# Patient Record
Sex: Female | Born: 1990 | Race: White | Hispanic: No | Marital: Married | Smoking: Current every day smoker
Health system: Southern US, Community
[De-identification: ages and names within clinical notes are randomized; demographics above are authoritative.]

## PROBLEM LIST (undated history)

## (undated) DIAGNOSIS — N921 Excessive and frequent menstruation with irregular cycle: Principal | ICD-10-CM

## (undated) DIAGNOSIS — Z3046 Encounter for surveillance of implantable subdermal contraceptive: Principal | ICD-10-CM

## (undated) DIAGNOSIS — N926 Irregular menstruation, unspecified: Principal | ICD-10-CM

## (undated) DIAGNOSIS — F319 Bipolar disorder, unspecified: Secondary | ICD-10-CM

## (undated) DIAGNOSIS — E119 Type 2 diabetes mellitus without complications: Secondary | ICD-10-CM

## (undated) DIAGNOSIS — Z309 Encounter for contraceptive management, unspecified: Secondary | ICD-10-CM

## (undated) DIAGNOSIS — N970 Female infertility associated with anovulation: Secondary | ICD-10-CM

## (undated) DIAGNOSIS — Z8669 Personal history of other diseases of the nervous system and sense organs: Secondary | ICD-10-CM

## (undated) DIAGNOSIS — N979 Female infertility, unspecified: Secondary | ICD-10-CM

## (undated) DIAGNOSIS — E669 Obesity, unspecified: Secondary | ICD-10-CM

## (undated) DIAGNOSIS — E282 Polycystic ovarian syndrome: Secondary | ICD-10-CM

## (undated) HISTORY — DX: Encounter for contraceptive management, unspecified: Z30.9

## (undated) HISTORY — DX: Female infertility, unspecified: N97.9

## (undated) HISTORY — DX: Excessive and frequent menstruation with irregular cycle: N92.1

## (undated) HISTORY — DX: Bipolar disorder, unspecified: F31.9

## (undated) HISTORY — PX: WISDOM TOOTH EXTRACTION: SHX21

## (undated) HISTORY — DX: Irregular menstruation, unspecified: N92.6

## (undated) HISTORY — DX: Female infertility associated with anovulation: N97.0

## (undated) HISTORY — DX: Encounter for surveillance of implantable subdermal contraceptive: Z30.46

## (undated) HISTORY — DX: Polycystic ovarian syndrome: E28.2

## (undated) HISTORY — DX: Personal history of other diseases of the nervous system and sense organs: Z86.69

## (undated) HISTORY — DX: Obesity, unspecified: E66.9

---
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
## 2010-02-25 ENCOUNTER — Emergency Department (HOSPITAL_COMMUNITY): Admission: EM | Admit: 2010-02-25 | Discharge: 2010-02-25 | Payer: Self-pay | Admitting: Emergency Medicine

## 2010-07-02 ENCOUNTER — Emergency Department (HOSPITAL_COMMUNITY): Admission: EM | Admit: 2010-07-02 | Discharge: 2010-07-02 | Payer: Self-pay | Admitting: Emergency Medicine

## 2010-07-17 ENCOUNTER — Emergency Department (HOSPITAL_COMMUNITY): Admission: EM | Admit: 2010-07-17 | Discharge: 2010-07-17 | Payer: Self-pay | Admitting: Emergency Medicine

## 2010-09-24 ENCOUNTER — Emergency Department (HOSPITAL_COMMUNITY)
Admission: EM | Admit: 2010-09-24 | Discharge: 2010-09-24 | Payer: Self-pay | Source: Home / Self Care | Admitting: Emergency Medicine

## 2010-09-30 LAB — COMPREHENSIVE METABOLIC PANEL WITH GFR
ALT: 14 U/L (ref 0–35)
AST: 17 U/L (ref 0–37)
Albumin: 3.7 g/dL (ref 3.5–5.2)
Alkaline Phosphatase: 82 U/L (ref 39–117)
BUN: 6 mg/dL (ref 6–23)
CO2: 27 meq/L (ref 19–32)
Calcium: 9.2 mg/dL (ref 8.4–10.5)
Chloride: 108 meq/L (ref 96–112)
Creatinine, Ser: 0.63 mg/dL (ref 0.4–1.2)
GFR calc Af Amer: >60 mL/min (ref >60)
GFR calc non Af Amer: >60 mL/min (ref >60)
Glucose, Bld: 85 mg/dL (ref 70–99)
Potassium: 3.9 meq/L (ref 3.5–5.1)
Sodium: 141 meq/L (ref 135–145)
Total Bilirubin: 0.4 mg/dL (ref 0.3–1.2)
Total Protein: 7 g/dL (ref 6.0–8.3)

## 2010-09-30 LAB — URINALYSIS, ROUTINE W REFLEX MICROSCOPIC
Bilirubin Urine: NEGATIVE
Hgb urine dipstick: NEGATIVE
Ketones, ur: NEGATIVE mg/dL
Nitrite: NEGATIVE
Protein, ur: NEGATIVE mg/dL
Specific Gravity, Urine: 1.015 (ref 1.005–1.030)
Urine Glucose, Fasting: NEGATIVE mg/dL
Urobilinogen, UA: 0.2 mg/dL (ref 0.0–1.0)
pH: 6.5 (ref 5.0–8.0)

## 2010-09-30 LAB — POCT PREGNANCY, URINE: Preg Test, Ur: NEGATIVE

## 2010-09-30 LAB — PREGNANCY, URINE: Preg Test, Ur: NEGATIVE

## 2010-09-30 LAB — LIPASE, BLOOD: Lipase: 20 U/L (ref 11–59)

## 2010-10-15 ENCOUNTER — Ambulatory Visit
Admission: RE | Admit: 2010-10-15 | Discharge: 2010-10-15 | Payer: Self-pay | Source: Home / Self Care | Attending: Gastroenterology | Admitting: Gastroenterology

## 2010-10-15 ENCOUNTER — Encounter: Payer: Self-pay | Admitting: Internal Medicine

## 2010-10-15 DIAGNOSIS — R110 Nausea: Secondary | ICD-10-CM | POA: Insufficient documentation

## 2010-10-15 DIAGNOSIS — R109 Unspecified abdominal pain: Secondary | ICD-10-CM | POA: Insufficient documentation

## 2010-10-16 ENCOUNTER — Encounter: Payer: Self-pay | Admitting: Gastroenterology

## 2010-10-21 ENCOUNTER — Encounter: Payer: Self-pay | Admitting: Internal Medicine

## 2010-10-23 NOTE — Assessment & Plan Note (Signed)
Summary: EPIGASTRIC PAIN,NAUSEA/SS   Visit Type:  Initial Consult Referring Provider:  Dr. Loney Hering Primary Care Provider:  Loney Hering  CC:  epigastric pain and nausea.  History of Present Illness: Ms. Cassandra [name] is a pleasant Caucasian 20 year old female who presents at the request of Dr. Loney Hering secondary to epigastric pain and nausea. Reports epigastric/LUQ pain X 1 mos, "sharp", constant 5/6 but worsens intermittently. Not aggravated by eating/drinking. Associated with nausea. Nothing relieves pain. No vomiting. Short use of ibuprofen in past three days, but no hx of chronic NSAIDs. 1 BM daily, denies melena or hematochezia, no constipation. Was given Prilosec on 1/9. Has taken daily X 2 weeks, did not alleviate symptoms. Not currently taking a PPI. Went to [facility] ED 09/24/10, LFTs nl, lipase nl. States had [place] of abdomen, but there are no reports available and hospital states no record of [place] exists.   Current Medications (verified): 1)  None  Allergies (verified): No Known Drug Allergies  Past History:  Past Medical History: Bipolar (not currently on medication)  Past Surgical History: wisdom teeth  Family History: Mother: living, s/p chole, hysterectomy, hip replacement ?HTN. Father:unknown No FH of Colon Cancer: Family History of Stomach Cancer:grandmother  Social History: Cosmetology school Single, no children Lives with mom and step-dad No smoking Alcohol Use - no Illicit Drug Use - no Drug Use:  no  Review of Systems General:  Denies fever, chills, and anorexia. Eyes:  Denies blurring, irritation, and discharge. ENT:  Denies sore throat, hoarseness, and difficulty swallowing. CV:  Denies chest pains and syncope. Resp:  Denies dyspnea at rest and wheezing. GI:  Complains of nausea and abdominal pain; denies difficulty swallowing, pain on swallowing, constipation, change in bowel habits, bloody BM's, and black BMs. GU:  Denies urinary burning and urinary frequency. MS:  Denies  joint pain / LOM, joint swelling, and joint stiffness. Derm:  Denies rash, itching, and dry skin. Neuro:  Denies weakness and syncope. Psych:  Denies depression and anxiety. Endo:  Denies cold intolerance and heat intolerance.  Vital Signs:  Patient profile:   20 year old female Height:      61 inches Weight:      199 pounds BMI:     37.74 Temp:     98.1 degrees F oral Pulse rate:   64 / minute BP sitting:   118 / 88  (left arm) Cuff size:   regular  Vitals Entered By: Hendricks Limes LPN (October 15, 2010 10:45 AM)  Physical Exam  General:  Well developed, well nourished, no acute distress. Head:  Normocephalic and atraumatic. Eyes:  PERRLA, no icterus. Mouth:  No deformity or lesions, dentition normal. Lungs:  Clear throughout to auscultation. Heart:  Regular rate and rhythm; no murmurs, rubs,  or bruits. Abdomen:  obese, +BS, soft, mildly tender to palpation epigastric/LUQ. no HSM, no rebound or guarding.  Msk:  Symmetrical with no gross deformities. Normal posture. Pulses:  Normal pulses noted. Extremities:  No clubbing, cyanosis, edema or deformities noted. Neurologic:  Alert and  oriented x4;  grossly normal neurologically. Psych:  Alert and cooperative. Normal mood and affect.  Impression & Recommendations:  Problem # 1:  ABDOMINAL PAIN-MULTIPLE SITES (ICD-63.37)  20 year old Caucasian female with epigastric and LUQ pain X 1 mos, constant, achy, not r/t eating/drinking. +nausea. short course of ibuprofen past three days but prior denies any use of NSAIDs, goodys, bc. Non-smoker. Prilosec X 2 weeks, taken daily, yet stopped recently. Diff includes gastritis, PUD. less likely biliary component.  Aciphex samples given to pt EGD with Dr. Jena Gauss in near future: the R/B/A discussed in detail; pt stated understanding and desires to proceed. If EGD nl, may need to proceed with [place] of abdomen  Orders: Consultation Level III ([id number])  Problem # 2:  NAUSEA (ICD-787.02)  See #  1.   Orders: Consultation Level III [phone])

## 2010-10-23 NOTE — Letter (Signed)
Summary: EGD ORDER  EGD ORDER   Imported By: Ave Filter 10/15/2010 11:56:55  _____________________________________________________________________  External Attachment:    Type:   Image     Comment:   External Document

## 2010-10-23 NOTE — Letter (Signed)
Summary: REF BY DR BLUTH  REF BY DR BLUTH   Imported By: Rexene Alberts 10/16/2010 15:01:00  _____________________________________________________________________  External Attachment:    Type:   Image     Comment:   External Document

## 2010-10-28 ENCOUNTER — Ambulatory Visit (HOSPITAL_COMMUNITY)
Admission: RE | Admit: 2010-10-28 | Discharge: 2010-10-28 | Disposition: A | Discharge status: Home / Self Care | Payer: Medicaid Other | Source: Ambulatory Visit | Attending: Internal Medicine | Admitting: Internal Medicine

## 2010-10-28 ENCOUNTER — Encounter: Payer: Medicaid Other | Admitting: Internal Medicine

## 2010-10-28 DIAGNOSIS — R1013 Epigastric pain: Secondary | ICD-10-CM

## 2010-10-28 DIAGNOSIS — K449 Diaphragmatic hernia without obstruction or gangrene: Secondary | ICD-10-CM

## 2010-10-28 DIAGNOSIS — R1012 Left upper quadrant pain: Secondary | ICD-10-CM | POA: Insufficient documentation

## 2010-10-28 DIAGNOSIS — R1011 Right upper quadrant pain: Secondary | ICD-10-CM

## 2010-10-30 ENCOUNTER — Ambulatory Visit (HOSPITAL_COMMUNITY)
Admit: 2010-10-30 | Discharge: 2010-10-30 | Disposition: A | Discharge status: Home / Self Care | Payer: Medicaid Other | Source: Ambulatory Visit | Attending: Internal Medicine | Admitting: Internal Medicine

## 2010-10-30 DIAGNOSIS — R1011 Right upper quadrant pain: Secondary | ICD-10-CM | POA: Insufficient documentation

## 2010-10-30 DIAGNOSIS — R1013 Epigastric pain: Secondary | ICD-10-CM | POA: Insufficient documentation

## 2010-10-31 NOTE — Letter (Signed)
Summary: [facility]CS ORDER  TCS ORDER   Imported By: Rexene Alberts 10/21/2010 11:50:51  _____________________________________________________________________  External Attachment:    Type:   Image     Comment:   External Document

## 2010-11-05 NOTE — Op Note (Signed)
  NAME:  [name], Cassandra               ACCOUNT NO.:  [id number]  MEDICAL RECORD NO.:  [id number]           PATIENT TYPE:  O  LOCATION:  [facility]                          FACILITY:  [facility]  PHYSICIAN:  R. Roetta Sessions, M.D. DATE OF BIRTH:  October 16, 1990  DATE OF PROCEDURE:  10/28/2010 DATE OF DISCHARGE:                              OPERATIVE REPORT   PROCEDURE:  EGD diagnostic.  INDICATIONS FOR PROCEDURE:  A 20 year old lady with epigastric some left upper quadrant abdominal pain, some relief with eating.  No dysphagia not much the way of any reflux symptoms, well improvement with acid suppression therapy recently.  EGD is now being done to further evaluate her symptoms.  Risks, benefits, alternatives, limitations, imponderables have been discussed, questions answered.  Please see the documentation in the medical record.  PROCEDURE NOTE:  O2 saturation, blood pressure, pulse, respirations were monitored throughout the entire procedure.  CONSCIOUS SEDATION:  Versed 5 mg IV, Demerol 100 mg IV in divided doses.  INSTRUMENT:  Pentax video chip system.  Cetacaine spray for topical pharyngeal anesthesia.  FINDINGS:  Examination of tubular esophagus revealed no mucosal abnormalities.  EG junction easily traversed.  Stomach:  Gastric cavity was emptied and insufflated well with air. Thorough examination of gastric mucosa including retroflexion of proximal stomach, esophagogastric junction demonstrated only a small hiatal hernia.  Pylorus was patent, easily traversed.  Examination of bulb and second portion revealed no abnormalities.  THERAPEUTIC/DIAGNOSTIC MANEUVERS PERFORMED:  None.  The patient tolerated the procedure well.  IMPRESSION: 1. Normal esophagus, small hiatal hernia, otherwise normal stomach, D1     and D2. 2. No endoscopic explanation for patient's symptoms based on today's     exam.  RECOMMENDATIONS: 1. We will proceed with abdominal/right upper quadrant ultrasound in   the near future. 2. Further recommendations to follow.     Jonathon Bellows, M.D.     RMR/MEDQ  D:  10/28/2010  T:  10/28/2010  Job:  [id number]  cc:   Ernestine Conrad, MD Jonita [place], [place]  Electronically Signed by Lorrin Goodell M.D. on 11/05/2010 02:34:15 PM

## 2012-04-03 IMAGING — CR DG FOOT COMPLETE 3+V*L*
3 series · 3 of 3 positions shown · non-contrast
Comparison: None.

CLINICAL DATA: Fall with pain and swelling.

LEFT FOOT - COMPLETE 3+ VIEW

[view not recorded (1 of 3)]
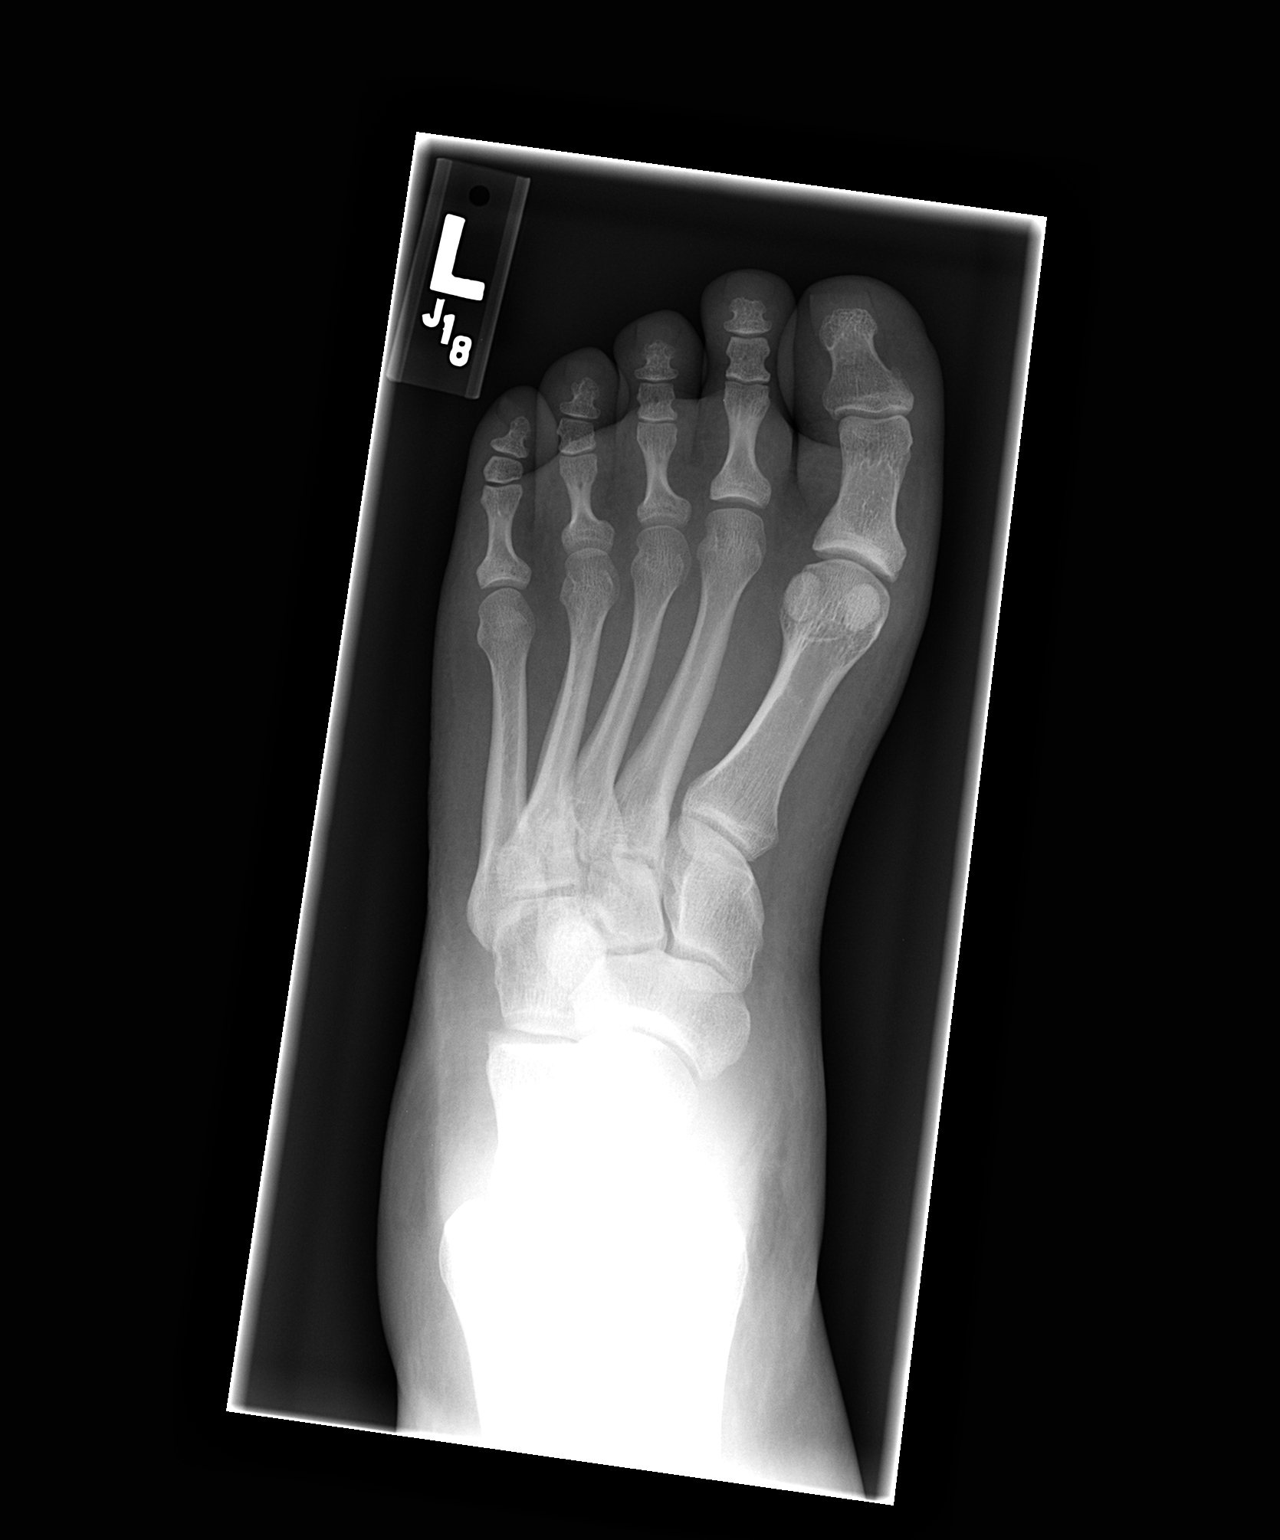

[view not recorded (2 of 3)]
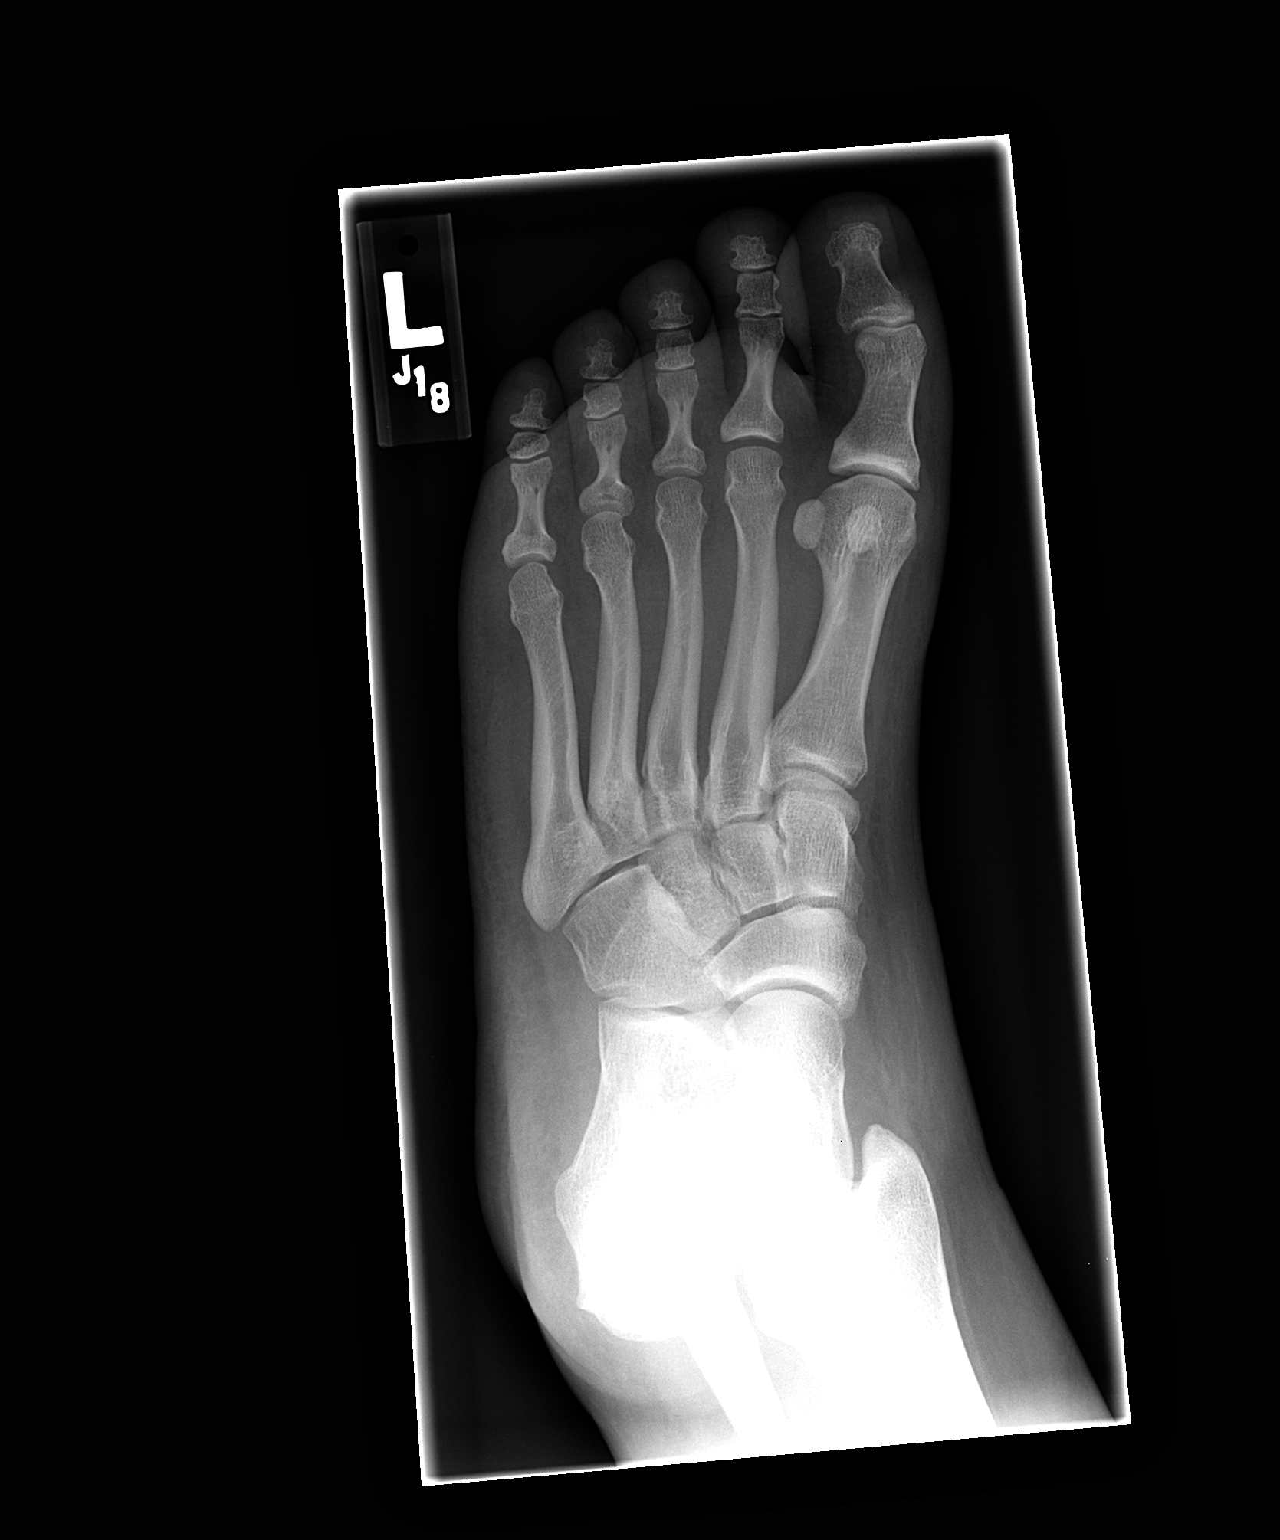

[view not recorded (3 of 3)]
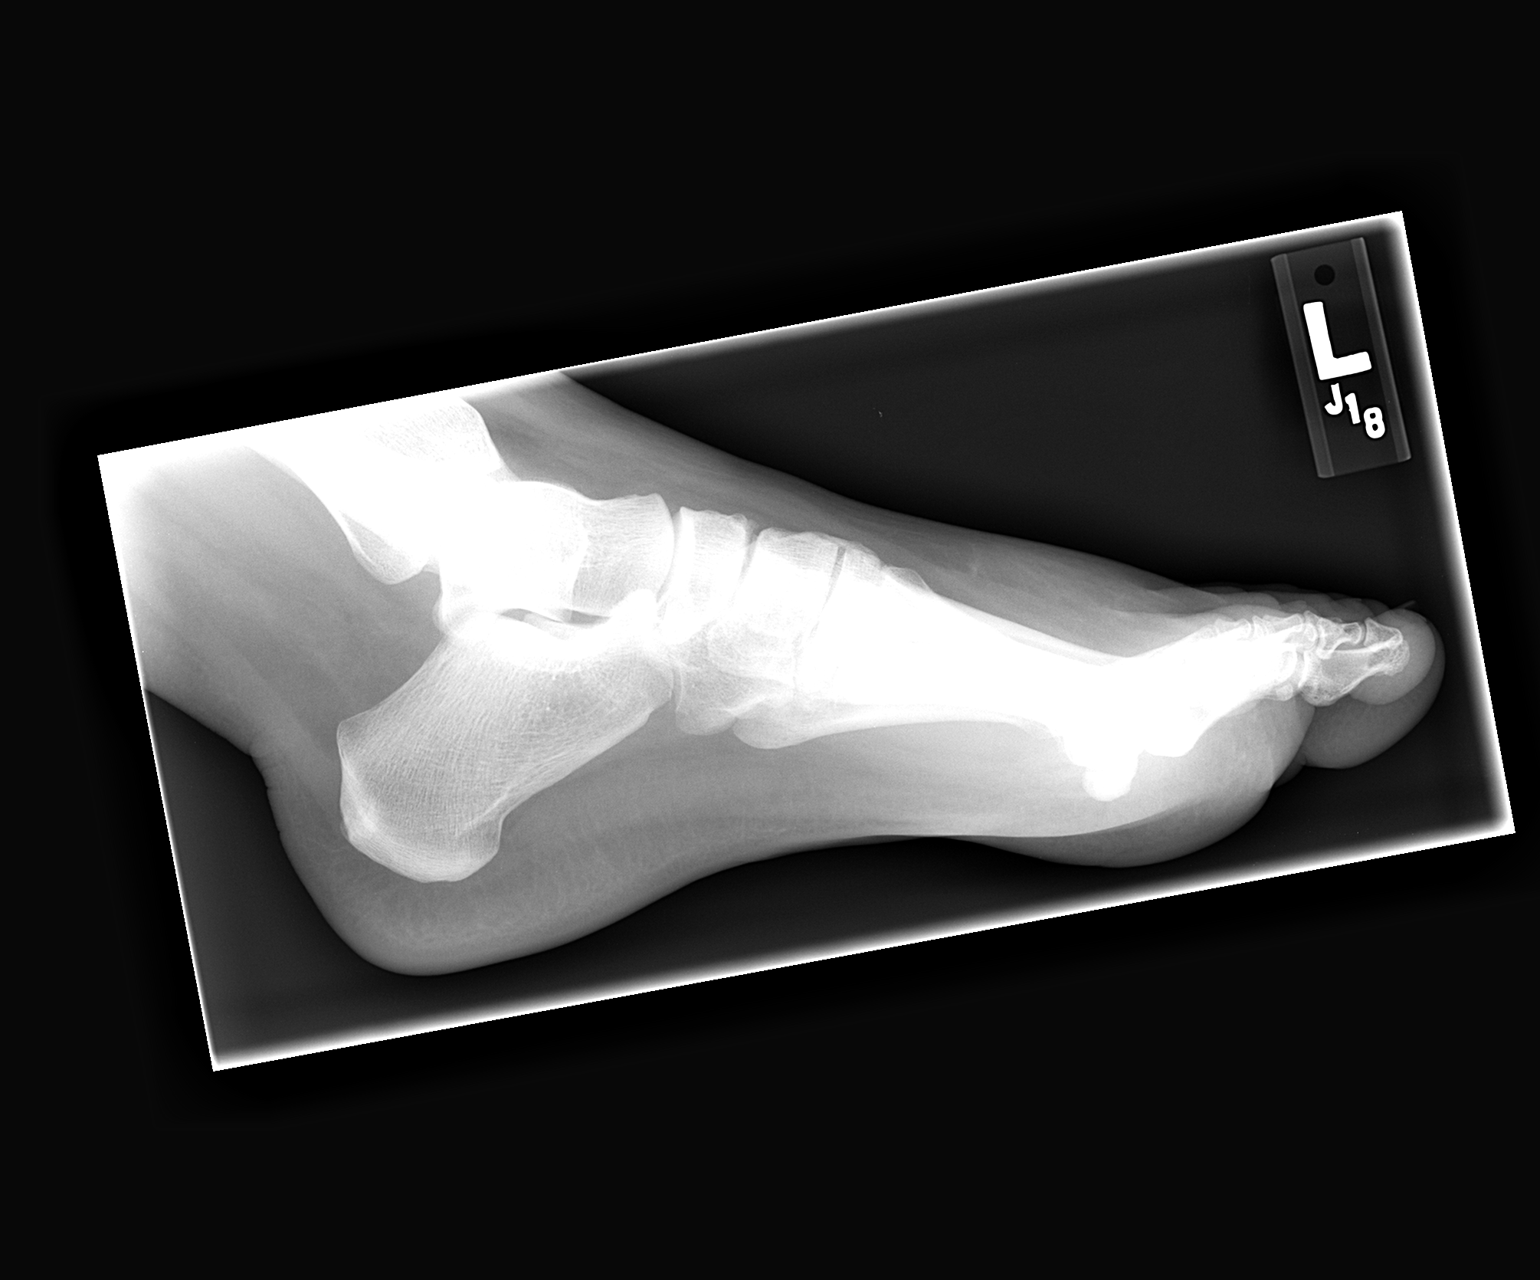

[3 of 3 positions shown; findings below may reference images not displayed]

FINDINGS: There is soft tissue swelling without acute fracture or
joint abnormality.
IMPRESSION: Soft tissue swelling without acute fracture or joint abnormality.

## 2012-07-17 IMAGING — US US ABDOMEN COMPLETE
1 series · 13 of 25 positions shown · non-contrast
Comparison: None

CLINICAL DATA: Right upper quadrant abdominal epigastric pain.

ABDOMINAL ULTRASOUND COMPLETE

[Series 1: us abdomen complete · 0.25mm/px · 13 of 83 slices shown]
[im 1/83]
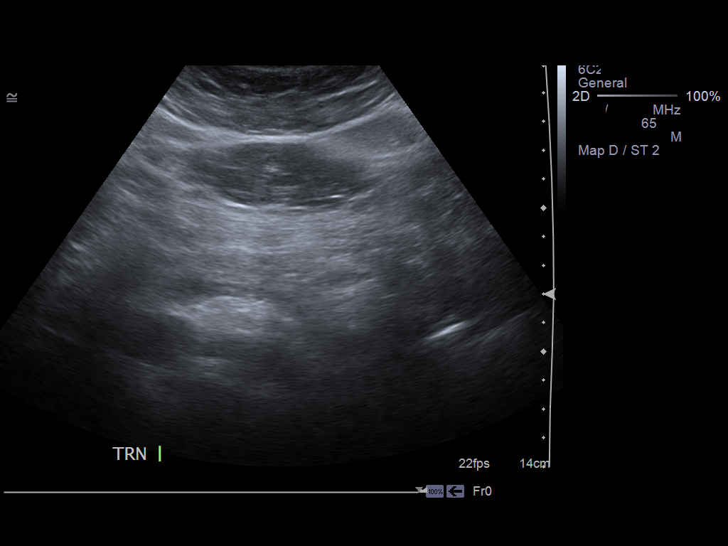
[im 7/83]
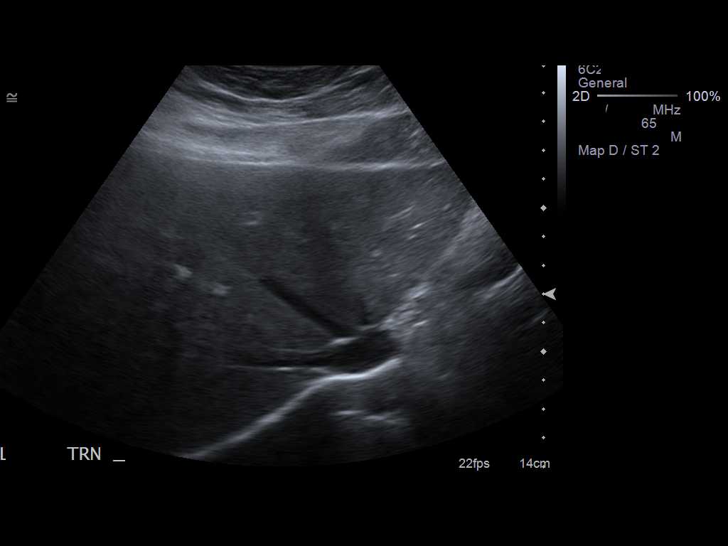
[im 14/83]
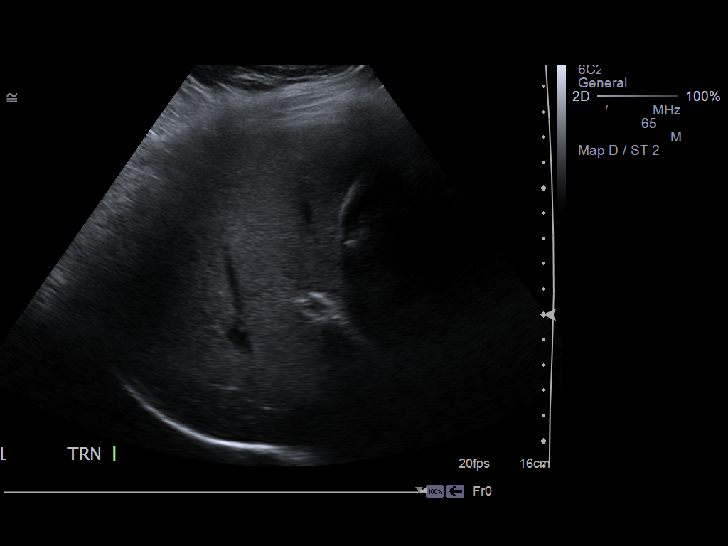
[im 21/83]
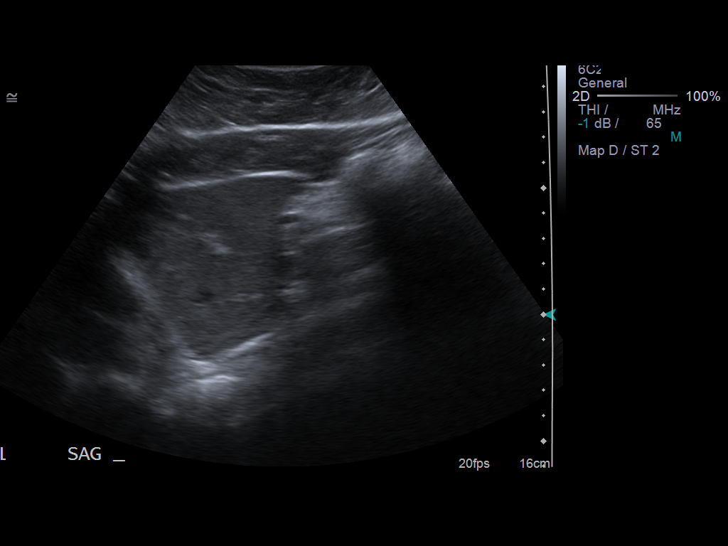
[im 28/83]
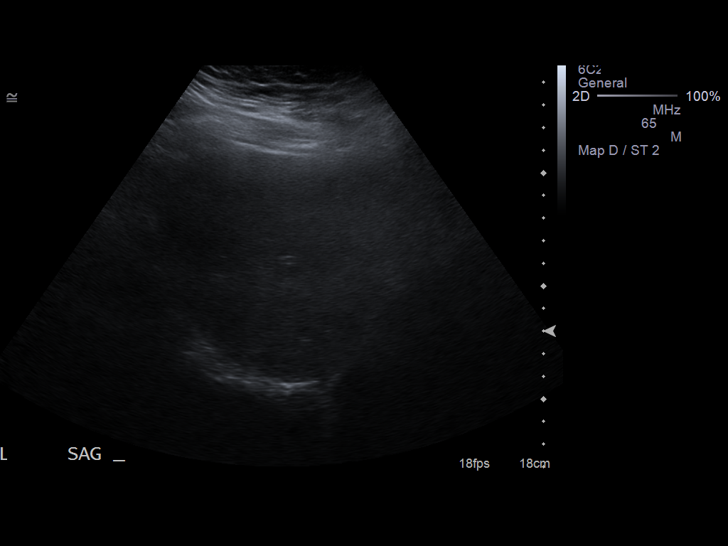
[im 35/83]
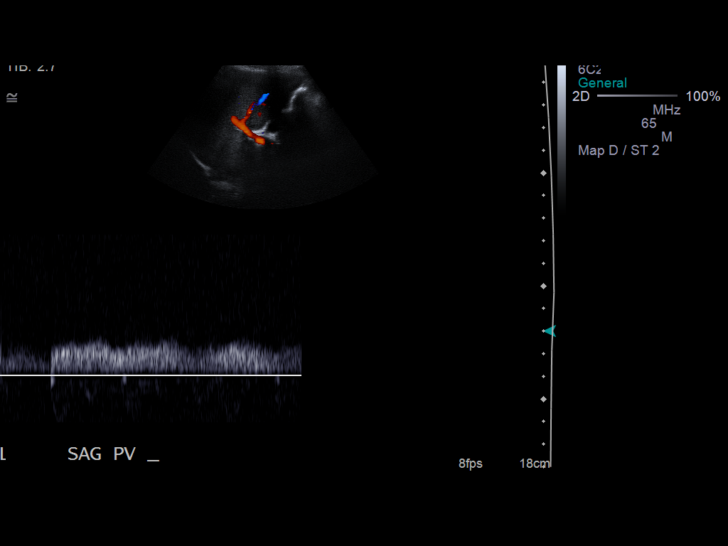
[im 42/83]
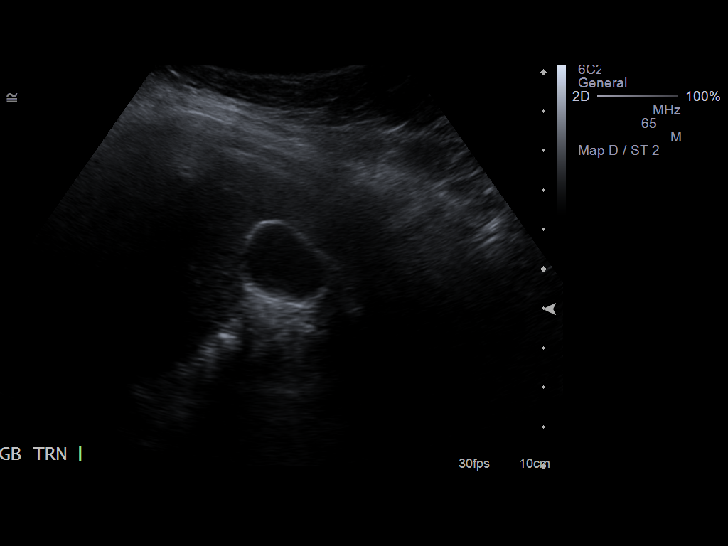
[im 48/83]
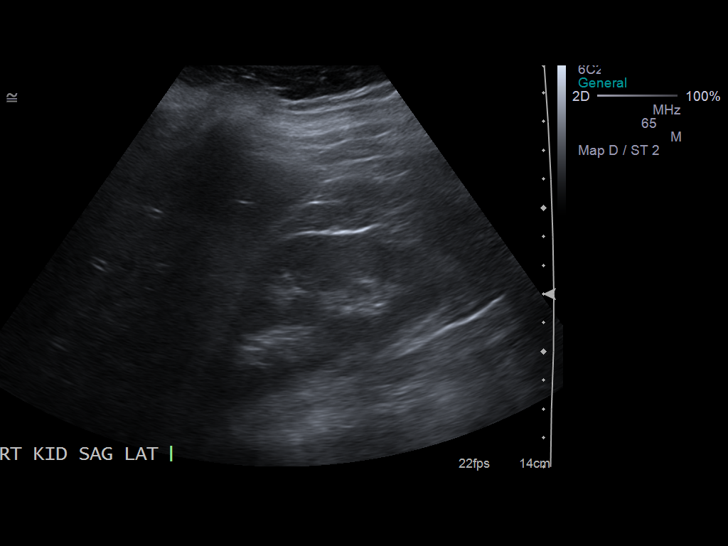
[im 55/83]
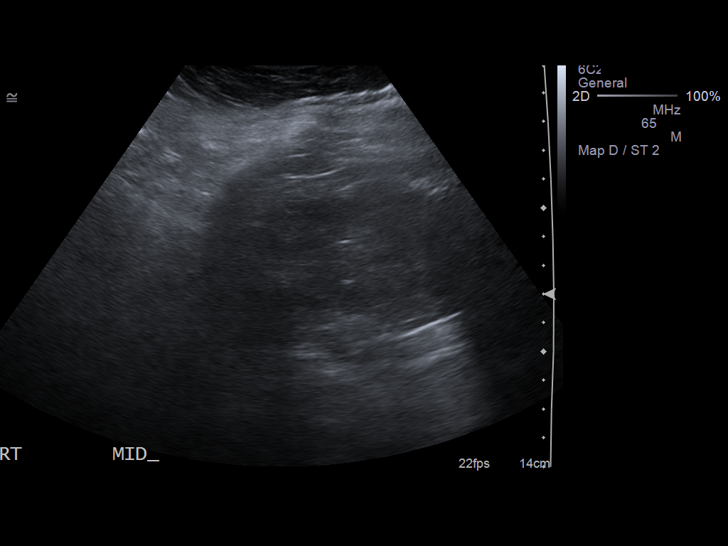
[im 62/83]
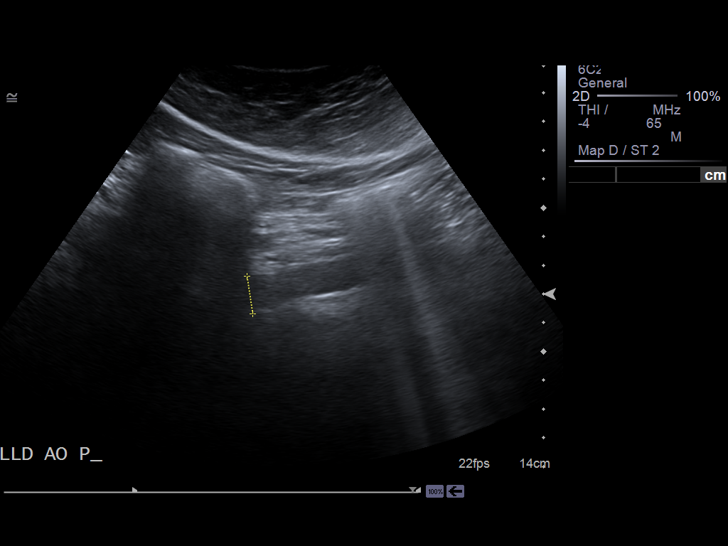
[im 69/83]
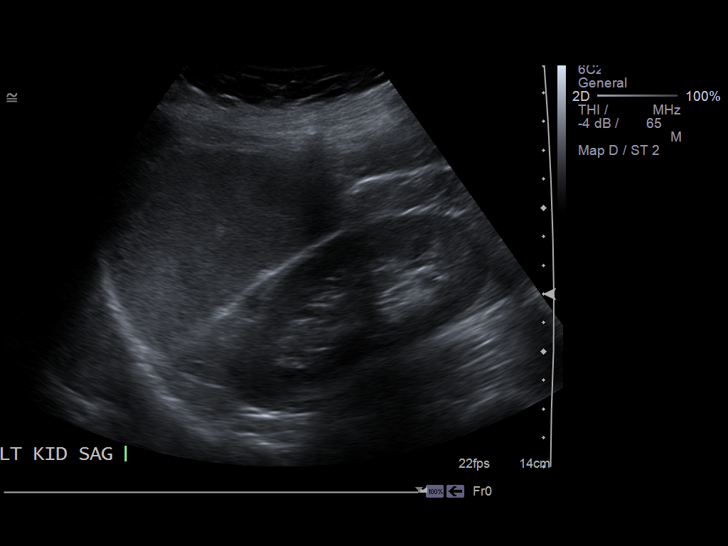
[im 76/83]
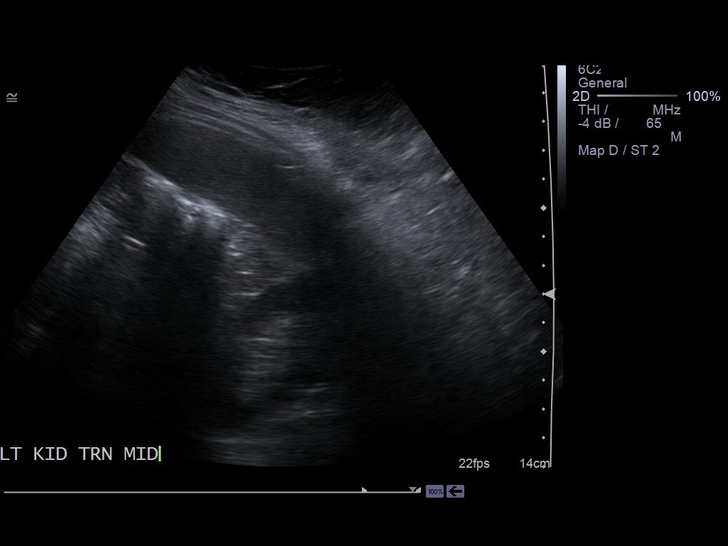
[im 83/83]
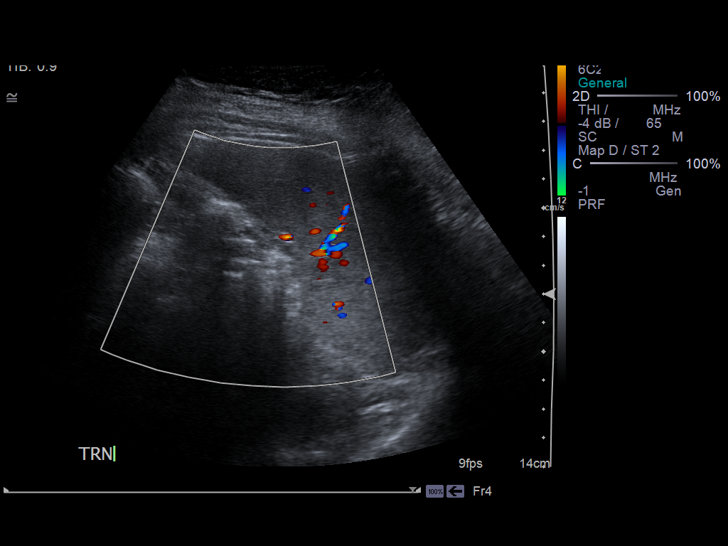

[13 of 25 positions shown; findings below may reference images not displayed]

FINDINGS: Gallbladder: No shadowing gallstones or echogenic sludge. No
gallbladder wall thickening or pericholecystic fluid. The
gallbladder wall thickness measured the upper limits of normal at 3
mm. No sonographic Murphy's sign according to the ultrasound
technologist.

CBD: Normal in caliber measuring 5 mm. No choledocholithiasis is
evident.

Liver:  Normal size and echotexture without focal parenchymal
abnormality.

IVC:  Patent throughout its visualized course in the abdomen.

Pancreas:  Although the pancreas is difficult to visualize in its
entirety, no focal pancreatic abnormality is identified.  Bowel gas
obscures portions of the pancreas.

Spleen:  Normal size and echotexture without focal abnormality.
Length is 7.5 cm.

Right kidney:  No hydronephrosis.  Well-preserved cortex.  Normal
parenchymal echotexture without focal abnormalities.  Right renal
length is 11.1 cm.

Left kidney:  No hydronephrosis.  Well-preserved cortex.  Normal
parenchymal echotexture without focal abnormalities.  Left renal
length is 10.6 cm.

Aorta:  Maximum diameter is 1.3 cm.  No aneurysm is evident.

Ascites:  None.
IMPRESSION: No abdominal pathology was demonstrated.

## 2013-11-22 ENCOUNTER — Ambulatory Visit (INDEPENDENT_AMBULATORY_CARE_PROVIDER_SITE_OTHER): Payer: BC Managed Care – PPO | Admitting: Adult Health

## 2013-11-22 ENCOUNTER — Encounter (INDEPENDENT_AMBULATORY_CARE_PROVIDER_SITE_OTHER): Payer: Self-pay

## 2013-11-22 ENCOUNTER — Encounter: Payer: Self-pay | Admitting: Women's Health

## 2013-11-22 VITALS — BP 130/78 | Ht 62.0 in | Wt 264.0 lb

## 2013-11-22 DIAGNOSIS — Z3049 Encounter for surveillance of other contraceptives: Secondary | ICD-10-CM

## 2013-11-22 DIAGNOSIS — Z309 Encounter for contraceptive management, unspecified: Secondary | ICD-10-CM | POA: Insufficient documentation

## 2013-11-22 DIAGNOSIS — Z3046 Encounter for surveillance of implantable subdermal contraceptive: Secondary | ICD-10-CM | POA: Insufficient documentation

## 2013-11-22 DIAGNOSIS — Z3202 Encounter for pregnancy test, result negative: Secondary | ICD-10-CM

## 2013-11-22 HISTORY — DX: Encounter for contraceptive management, unspecified: Z30.9

## 2013-11-22 HISTORY — DX: Encounter for surveillance of implantable subdermal contraceptive: Z30.46

## 2013-11-22 LAB — POCT URINE PREGNANCY: Preg Test, Ur: NEGATIVE

## 2013-11-22 MED ORDER — NORETHINDRONE 0.35 MG PO TABS: 1.0000 | ORAL_TABLET | Freq: Every day | ORAL | Status: DC

## 2013-11-22 NOTE — Progress Notes (Signed)
Subjective:     Patient ID: Cassandra SchimkeJessica A [name], female   DOB: 04/22/1991, 23 y.o.   MRN: [id number]  HPI Cassandra BumpsJessica is a 23 year old white female in for implanon removal, she got in 2010 and has had only 1 period since then and has gained about 100 lbs.She went to [facility] and they could not find the rod.Has ? History of migraines with aura.  Review of Systems See HPI Reviewed past medical,surgical, social and family history. Reviewed medications and allergies.     Objective:   Physical Exam BP 130/78  Ht 5\' 2"  (1.575 m)  Wt 264 lb (119.75 kg)  BMI 48.27 kg/m2   UPT negative, verbal consent obtained, found scar for insertion and ?rod, Dr Despina HiddenEure came in and left arm cleansed with betadine, and injected with 1.5 cc 2% lidocaine and waited til numb, a #11 blade was used to make small  incision, and forceps was used to try to find rod, but was deep and Dr Despina HiddenEure could not grasp, inserted with 1.5 cc 2% lidocaine again and still could not remove, did remove small amount of sub Q fat.Arm cleaned up, Steri strips applied. Pressure dressing applied. Discussed options of another nexplanon, IUD,DEPO and POP, will try POP. Pt aware will have bruising and be sore tomorrow.  Assessment:     Unable to remove implanon Contraceptive management     Plan:       Use condoms x 4 weeks, keep clean and dry x 24 hours, no heavy lifting, keep steri strips on x 72 hours, Keep pressure dressing on x 24 hours. Start micronor today, Rx Micronor disp 1 pack take 1 daily x 1 year Return in 2 weeks to see Dr Despina HiddenEure for removal Can take advil for pain if needed

## 2013-11-22 NOTE — Patient Instructions (Addendum)
Use condoms x 4 weeks, keep clean and dry x 24 hours, no heavy lifting, keep steri strips on x 72 hours, Keep pressure dressing on x 24 hours.  Start pills today  Return in 2 weeks to see Dr Despina HiddenEure for removal Can take advil for pain if needed

## 2013-12-06 ENCOUNTER — Encounter: Payer: Self-pay | Admitting: Obstetrics & Gynecology

## 2013-12-06 ENCOUNTER — Ambulatory Visit (INDEPENDENT_AMBULATORY_CARE_PROVIDER_SITE_OTHER): Payer: BC Managed Care – PPO | Admitting: Obstetrics & Gynecology

## 2013-12-06 ENCOUNTER — Encounter: Payer: BC Managed Care – PPO | Admitting: Obstetrics & Gynecology

## 2013-12-06 VITALS — BP 110/80 | Ht 62.0 in | Wt 263.0 lb

## 2013-12-06 DIAGNOSIS — Z309 Encounter for contraceptive management, unspecified: Secondary | ICD-10-CM

## 2013-12-06 DIAGNOSIS — Z3046 Encounter for surveillance of implantable subdermal contraceptive: Secondary | ICD-10-CM

## 2014-01-22 NOTE — Progress Notes (Signed)
Patient ID: Evelene CroonJessica Capps, female   DOB: 09/23/1990, 23 y.o.   MRN: [id number] Pt returned for repeat attempt at nexplanon removal I was able to retrieve it this time after prep, drape and incision of skin Removed intact Bandage placed  Follow up prn On ocp  Past Medical History  Diagnosis Date  . Bipolar 1 disorder     no medication  . Encounter for Implanon removal 11/22/2013    Could not remove today will try again in 2 weeks, has ben in since 2010 and pt has gained weight  . Contraceptive management 11/22/2013    Past Surgical History  Procedure Laterality Date  . Wisdom tooth extraction      OB History   Grav Para Term Preterm Abortions TAB SAB Ect Mult Living                  No Known Allergies  History   Social History  . Marital Status: Single    Spouse Name: N/A    Number of Children: N/A  . Years of Education: N/A   Social History Main Topics  . Smoking status: Former Smoker    Types: Cigarettes  . Smokeless tobacco: Never Used  . Alcohol Use: Yes     Comment: socially  . Drug Use: No  . Sexual Activity: Yes    Birth Control/ Protection: Implant, Pill, Condom   Other Topics Concern  . None   Social History Narrative  . None    Family History  Problem Relation Age of Onset  . Cancer Maternal Grandmother     stomach  . Cancer Maternal Grandfather     lung  . COPD Mother

## 2014-01-31 ENCOUNTER — Encounter: Payer: Self-pay | Admitting: Adult Health

## 2014-01-31 ENCOUNTER — Other Ambulatory Visit (HOSPITAL_COMMUNITY)
Admission: RE | Admit: 2014-01-31 | Discharge: 2014-01-31 | Disposition: A | Discharge status: Home / Self Care | Payer: BC Managed Care – PPO | Source: Ambulatory Visit | Attending: Obstetrics and Gynecology | Admitting: Obstetrics and Gynecology

## 2014-01-31 ENCOUNTER — Ambulatory Visit (INDEPENDENT_AMBULATORY_CARE_PROVIDER_SITE_OTHER): Payer: BC Managed Care – PPO | Admitting: Adult Health

## 2014-01-31 VITALS — BP 120/80 | HR 78 | Ht 62.0 in | Wt 264.0 lb

## 2014-01-31 DIAGNOSIS — Z01419 Encounter for gynecological examination (general) (routine) without abnormal findings: Secondary | ICD-10-CM | POA: Insufficient documentation

## 2014-01-31 NOTE — Progress Notes (Signed)
Patient ID: Cassandra CroonJessica [name], female   DOB: 09/21/1990, 23 y.o.   MRN: [id number] History of Present Illness: Cassandra BumpsJessica is a 23 year old white female in for a pap and physical. She says left arm is sore where nexplanon removed.  Current Medications, Allergies, Past Medical History, Past Surgical History, Family History and Social History were reviewed in [facility] electronic medical record.     Review of Systems: Patient denies any headaches, blurred vision, shortness of breath, chest pain, abdominal pain, problems with bowel movements, urination, or intercourse. No joint pain or mood changes at present.She has not had a period on Micronor, did spot once.But she says she may want a baby in about 3 years.    Physical Exam:BP 120/80  Pulse 78  Ht 5\' 2"  (1.575 m)  Wt 264 lb (119.75 kg)  BMI 48.27 kg/m2 General:  Well developed, well nourished, no acute distress Skin:  Warm and dry Neck:  Midline trachea, normal thyroid Lungs; Clear to auscultation bilaterally Breast:  No dominant palpable mass, retraction, or nipple discharge Cardiovascular: Regular rate and rhythm Abdomen:  Soft, non tender, no hepatosplenomegaly,obese Pelvic:  External genitalia is normal in appearance.  The vagina is normal in appearance.The cervix is nulliparous, pap performed.  Uterus is felt to be normal size, shape, and contour.  No  adnexal masses or tenderness noted. Extremities:  No swelling or varicosities noted Psych:  No mood changes, alert and cooperative, seems happy No bruising or swelling or redness of left arm. Discussed that she needed to get healthy before trying to get pregnant, get shots up to date, check labs, diet changes.  Impression: Yearly gyn exam   Plan: Physical in 1 year Return in 1 week for fasting labs, CBC,CMP,TSH and lipids Continue micronor Review handout on preparing for a baby

## 2014-01-31 NOTE — Patient Instructions (Addendum)
Physical in 1 year Return in 1 week for fasting labs Preparing for Pregnancy Preparing for pregnancy (preconceptual care) by getting counseling and information from your caregiver before getting pregnant is a good idea. It will help you and your baby have a better chance to have a healthy, safe pregnancy and delivery of your baby. Make an appointment with your caregiver to talk about your health, medical, and family history and how to prepare yourself before getting pregnant. Your caregiver will do a complete physical exam and a Pap test. They will want to know:  About you, your spouse or partner, and your family's medical and genetic history.  If you are eating a balanced diet and drinking enough fluids.  What vitamins and mineral supplements you are taking. This includes taking folic acid before getting pregnant to help prevent birth defects.  What medications you are taking including prescription, over-the-counter and herbal medications.  If there is any substance abuse like alcohol, smoking, and illegal drugs.  If there is any mental or physical domestic violence.  If there is any risk of sexually transmitted disease between you and your partner.  What immunizations and vaccinations you have had and what you may need before getting pregnant.  If you should get tested for HIV infection.  If there is any exposure to chemical or toxic substances at home or work.  If there are medical problems you have that need to be treated and kept under control before getting pregnant such as diabetes, high blood pressure or others.  If there were any past surgeries, pregnancies and problems with them.  What your current weight is and to set a goal as to how much weight you should gain while pregnant. Also, they will check if you should lose or gain weight before getting pregnant.  What is your exercise routine and what it is safe when you are pregnant.  If there are any physical disabilities  that need to be addressed.  About spacing your pregnancies when there are other children.  If there is a financial problem that may affect you having a child. After talking about the above points with your caregiver, your caregiver will give you advice on how to help treat and work with you on solving any issues, if necessary, before getting pregnant. The goal is to have a healthy and safe pregnancy for you and your baby. You should keep an accurate record of your menstrual periods because it will help in determining your due date. Immunizations that you should have before getting pregnant:   Regular measles, [place] measles (rubella) and mumps.  Tetanus and diphtheria.  Chickenpox, if not immune.  Herpes zoster (Varicella) if not immune.  Human papilloma virus vaccine (HPV) between the age of 74 and 14 years old.  Hepatitis A vaccine.  Hepatitis B vaccine.  Influenza vaccine.  Pneumococcal vaccine (pneumonia). You should avoid getting pregnant for one month after getting vaccinated with a live virus vaccine such as [place] measles (rubella) which is in the MMR (Measles, Mumps and Rubella) vaccine. Other immunizations may be necessary depending on where you live, such as malaria. Ask your caregiver if any other immunizations are needed for you. HOME CARE INSTRUCTIONS   Follow the advice of your caregiver.  Before getting pregnant:  Begin taking vitamins, supplements, and 0.4 milligrams folic acid daily.  Get your immunizations up-to-date.  Get help from a nutrition counselor if you do not understand what a balanced diet is, need help with a special medical  diet or if you need help to lose or gain weight.  Begin exercising.  Stop smoking, taking illegal drugs, and drinking alcoholic beverages.  Get counseling if there is and type of domestic violence.  Get checked for sexually transmitted diseases including HIV.  Get any medical problems under control (diabetes, high blood  pressure, convulsions, asthma or others).  Resolve any financial concerns or create a plan to do so.  Be sure you and your spouse or partner are ready to have a baby.  Keep an accurate record of your menstrual periods. Document Released: 08/14/2008 Document Revised: 06/22/2013 Document Reviewed: 08/14/2008 [facility] Patient Information 2014 [place].

## 2014-02-13 ENCOUNTER — Other Ambulatory Visit: Payer: BC Managed Care – PPO

## 2014-02-13 DIAGNOSIS — Z13220 Encounter for screening for lipoid disorders: Secondary | ICD-10-CM

## 2014-02-13 DIAGNOSIS — Z0000 Encounter for general adult medical examination without abnormal findings: Secondary | ICD-10-CM

## 2014-02-13 DIAGNOSIS — Z1329 Encounter for screening for other suspected endocrine disorder: Secondary | ICD-10-CM

## 2014-02-14 LAB — LIPID PANEL
Cholesterol: 164 mg/dL (ref 0–200)
HDL: 30 mg/dL — ABNORMAL LOW (ref >39)
LDL Cholesterol: 107 mg/dL — ABNORMAL HIGH (ref 0–99)
Total CHOL/HDL Ratio: 5.5 Ratio
Triglycerides: 136 mg/dL (ref <150)
VLDL: 27 mg/dL (ref 0–40)

## 2014-02-14 LAB — COMPREHENSIVE METABOLIC PANEL
ALT: 18 U/L (ref 0–35)
AST: 15 U/L (ref 0–37)
Albumin: 3.7 g/dL (ref 3.5–5.2)
Alkaline Phosphatase: 76 U/L (ref 39–117)
BUN: 7 mg/dL (ref 6–23)
CO2: 26 mEq/L (ref 19–32)
Calcium: 9.1 mg/dL (ref 8.4–10.5)
Chloride: 104 mEq/L (ref 96–112)
Creat: 0.54 mg/dL (ref 0.50–1.10)
Glucose, Bld: 96 mg/dL (ref 70–99)
Potassium: 4.7 mEq/L (ref 3.5–5.3)
Sodium: 139 mEq/L (ref 135–145)
Total Bilirubin: 0.4 mg/dL (ref 0.2–1.2)
Total Protein: 6.3 g/dL (ref 6.0–8.3)

## 2014-02-14 LAB — CBC
HCT: 42 % (ref 36.0–46.0)
Hemoglobin: 13.9 g/dL (ref 12.0–15.0)
MCH: 26.8 pg (ref 26.0–34.0)
MCHC: 33.1 g/dL (ref 30.0–36.0)
MCV: 81.1 fL (ref 78.0–100.0)
Platelets: 303 10*3/uL (ref 150–400)
RBC: 5.18 MIL/uL — ABNORMAL HIGH (ref 3.87–5.11)
RDW: 16 % — ABNORMAL HIGH (ref 11.5–15.5)
WBC: 9.4 10*3/uL (ref 4.0–10.5)

## 2014-02-14 LAB — TSH: TSH: 2.579 u[IU]/mL (ref 0.350–4.500)

## 2014-02-15 ENCOUNTER — Telehealth: Payer: Self-pay | Admitting: *Deleted

## 2014-02-15 NOTE — Telephone Encounter (Signed)
Pt stated that she had labs done on Monday and wanted results. Pt aware of all results.

## 2014-09-26 ENCOUNTER — Encounter: Payer: Self-pay | Admitting: Adult Health

## 2014-09-26 ENCOUNTER — Ambulatory Visit (INDEPENDENT_AMBULATORY_CARE_PROVIDER_SITE_OTHER): Payer: BLUE CROSS/BLUE SHIELD | Admitting: Adult Health

## 2014-09-26 VITALS — BP 120/72 | Ht 62.0 in | Wt 261.0 lb

## 2014-09-26 DIAGNOSIS — N926 Irregular menstruation, unspecified: Secondary | ICD-10-CM

## 2014-09-26 HISTORY — DX: Irregular menstruation, unspecified: N92.6

## 2014-09-26 NOTE — Patient Instructions (Signed)
Polycystic Ovarian Syndrome Polycystic ovarian syndrome (PCOS) is a common hormonal disorder among women of reproductive age. Most women with PCOS grow many small cysts on their ovaries. PCOS can cause problems with your periods and make it difficult to get pregnant. It can also cause an increased risk of miscarriage with pregnancy. If left untreated, PCOS can lead to serious health problems, such as diabetes and heart disease. CAUSES The cause of PCOS is not fully understood, but genetics may be a factor. SIGNS AND SYMPTOMS   Infrequent or no menstrual periods.   Inability to get pregnant (infertility) because of not ovulating.   Increased growth of hair on the face, chest, stomach, back, thumbs, thighs, or toes.   Acne, oily skin, or dandruff.   Pelvic pain.   Weight gain or obesity, usually carrying extra weight around the waist.   Type 2 diabetes.   High cholesterol.   High blood pressure.   Female-pattern baldness or thinning hair.   Patches of thickened and dark brown or black skin on the neck, arms, breasts, or thighs.   Tiny excess flaps of skin (skin tags) in the armpits or neck area.   Excessive snoring and having breathing stop at times while asleep (sleep apnea).   Deepening of the voice.   Gestational diabetes when pregnant.  DIAGNOSIS  There is no single test to diagnose PCOS.   Your health care provider will:   Take a medical history.   Perform a pelvic exam.   Have ultrasonography done.   Check your female and female hormone levels.   Measure glucose or sugar levels in the blood.   Do other blood tests.   If you are producing too many female hormones, your health care provider will make sure it is from PCOS. At the physical exam, your health care provider will want to evaluate the areas of increased hair growth. Try to allow natural hair growth for a few days before the visit.   During a pelvic exam, the ovaries may be enlarged  or swollen because of the increased number of small cysts. This can be seen more easily by using vaginal ultrasonography or screening to examine the ovaries and lining of the uterus (endometrium) for cysts. The uterine lining may become thicker if you have not been having a regular period.  TREATMENT  Because there is no cure for PCOS, it needs to be managed to prevent problems. Treatments are based on your symptoms. Treatment is also based on whether you want to have a baby or whether you need contraception.  Treatment may include:   Progesterone hormone to start a menstrual period.   Birth control pills to make you have regular menstrual periods.   Medicines to make you ovulate, if you want to get pregnant.   Medicines to control your insulin.   Medicine to control your blood pressure.   Medicine and diet to control your high cholesterol and triglycerides in your blood.  Medicine to reduce excessive hair growth.  Surgery, making small holes in the ovary, to decrease the amount of female hormone production. This is done through a long, lighted tube (laparoscope) placed into the pelvis through a tiny incision in the lower abdomen.  HOME CARE INSTRUCTIONS  Only take over-the-counter or prescription medicine as directed by your health care provider.  Pay attention to the foods you eat and your activity levels. This can help reduce the effects of PCOS.  Keep your weight under control.  Eat foods that are   low in carbohydrate and high in fiber.  Exercise regularly. SEEK MEDICAL CARE IF:  Your symptoms do not get better with medicine.  You have new symptoms. Document Released: 12/26/2004 Document Revised: 06/22/2013 Document Reviewed: 02/17/2013 [facility] Patient Information 2015 [place]. This information is not intended to replace advice given to you by your health care provider. Make sure you discuss any questions you have with your health care provider. Keep taking  micronor Return in 1 week for US and see me

## 2014-09-26 NOTE — Progress Notes (Signed)
Subjective:     Patient ID: Cassandra [name], female   DOB: 11/05/1990, 24 y.o.   MRN: [id number]  HPI Cassandra [name] is a 24 year old white female in complaining of no "real" period just spots some.She started at a 16 and periods never regular, has been about 6 years since period had flow.She had been on implanon and then micronor.She is getting married next month.And she wants children.  Review of Systems See HPI Reviewed past medical,surgical, social and family history. Reviewed medications and allergies.     Objective:   Physical Exam BP 120/72 mmHg  Ht 5\' 2"  (1.575 m)  Wt 261 lb (118.389 kg)  BMI 47.73 kg/m2   Skin warm and dry. Neck: mid line trachea, normal thyroid. Lungs: clear to ausculation bilaterally. Cardiovascular: regular rate and rhythm.Discussed getting US to assess ovaries, may have PCO.  Assessment:     Irregular periods    Plan:    Review handout on PCO Continue micronor Return in 1 week for US and see me

## 2014-10-05 ENCOUNTER — Ambulatory Visit (INDEPENDENT_AMBULATORY_CARE_PROVIDER_SITE_OTHER): Payer: BLUE CROSS/BLUE SHIELD

## 2014-10-05 ENCOUNTER — Ambulatory Visit (INDEPENDENT_AMBULATORY_CARE_PROVIDER_SITE_OTHER): Payer: BLUE CROSS/BLUE SHIELD | Admitting: Adult Health

## 2014-10-05 ENCOUNTER — Encounter: Payer: Self-pay | Admitting: Adult Health

## 2014-10-05 VITALS — BP 118/80 | Ht 62.0 in | Wt 257.0 lb

## 2014-10-05 DIAGNOSIS — E282 Polycystic ovarian syndrome: Secondary | ICD-10-CM

## 2014-10-05 DIAGNOSIS — N926 Irregular menstruation, unspecified: Secondary | ICD-10-CM

## 2014-10-05 HISTORY — DX: Polycystic ovarian syndrome: E28.2

## 2014-10-05 MED ORDER — NORETHINDRONE 0.35 MG PO TABS: 1.0000 | ORAL_TABLET | Freq: Every day | ORAL | Status: DC

## 2014-10-05 NOTE — Patient Instructions (Signed)
Will talk when labs back Take micronor daily till wants to be pregnant, will start clomid then Try to lose weight

## 2014-10-05 NOTE — Progress Notes (Signed)
Subjective:     Patient ID: Cassandra [name], female   DOB: 04/09/1991, 24 y.o.   MRN: [id number]  HPI Cassandra [name] is a 24 year old white female in for [place] for ?PCO, has irregular periods.  Review of Systems See HPI Reviewed past medical,surgical, social and family history. Reviewed medications and allergies.      Objective:   Physical Exam BP 118/80 mmHg  Ht 5\' 2"  (1.575 m)  Wt 257 lb (116.574 kg)  BMI 46.99 kg/m2   Had US that showed bilateral PCO appearance of ovaries, endometrium 5.0 mm and symmetrical, uterus WNL, pt aware of this, also discussed with Dr Despina HiddenEure.Pt had reviewed PCO handout, no questions.  Assessment:    Irregular periods PCO appearance    Plan:    Try to lose weight Refilled micronor x 1 year Keep taking micronor til ready for pregnancy will give clomid then Check A1c next week, may add metformin

## 2014-10-10 ENCOUNTER — Other Ambulatory Visit: Payer: BLUE CROSS/BLUE SHIELD

## 2014-10-11 ENCOUNTER — Other Ambulatory Visit: Payer: BLUE CROSS/BLUE SHIELD

## 2014-10-11 DIAGNOSIS — Z139 Encounter for screening, unspecified: Secondary | ICD-10-CM

## 2014-10-13 LAB — HEMOGLOBIN A1C
Est. average glucose Bld gHb Est-mCnc: 134 mg/dL
Hgb A1c MFr Bld: 6.3 % — ABNORMAL HIGH (ref 4.8–5.6)

## 2014-10-16 ENCOUNTER — Telehealth: Payer: Self-pay | Admitting: Adult Health

## 2014-10-16 MED ORDER — METFORMIN HCL 500 MG PO TABS: 500.0000 mg | ORAL_TABLET | Freq: Every day | ORAL | Status: DC

## 2014-10-16 NOTE — Telephone Encounter (Signed)
Left message that  A1c 6.3, will rx metformin 500 mg 1 daily and try to lose weight by decreasing carbs and calories, and call me back with any questions

## 2014-10-16 NOTE — Telephone Encounter (Signed)
Pt aware of labs wil recheck A1c in 6 months take metformin and eat 6 x daily with increased protein,decrease carbs

## 2014-12-13 ENCOUNTER — Ambulatory Visit (INDEPENDENT_AMBULATORY_CARE_PROVIDER_SITE_OTHER): Payer: BLUE CROSS/BLUE SHIELD | Admitting: Adult Health

## 2014-12-13 ENCOUNTER — Encounter: Payer: Self-pay | Admitting: Adult Health

## 2014-12-13 VITALS — BP 100/60 | HR 72 | Ht 62.0 in | Wt 253.0 lb

## 2014-12-13 DIAGNOSIS — N921 Excessive and frequent menstruation with irregular cycle: Secondary | ICD-10-CM | POA: Diagnosis not present

## 2014-12-13 HISTORY — DX: Excessive and frequent menstruation with irregular cycle: N92.1

## 2014-12-13 NOTE — Patient Instructions (Addendum)
Keep taking micronor and metformin  Pap in 2017  Follow up as scheduled

## 2014-12-13 NOTE — Progress Notes (Signed)
Subjective:     Patient ID: Cassandra [name], female   DOB: 07/01/1991, 24 y.o.   MRN: [id number]  HPI Cassandra [name] is a 24 year old white female, recently married in complains of irregular bleeding for 2 months, is on micronor and started metformin in January.She had A1c 6.3 and has history of PCO.The period lasts 4-5 days then spots light not even enough to get on pad.No pain or odor.  Review of Systems + irregular bleeding, all other systems negative Reviewed past medical,surgical, social and family history. Reviewed medications and allergies.     Objective:   Physical Exam BP 100/60 mmHg  Pulse 72  Ht 5\' 2"  (1.575 m)  Wt 253 lb (114.76 kg)  BMI 46.26 kg/m2  LMP 12/10/2014 Skin warm and dry.Pelvic: external genitalia is normal in appearance no lesions, vagina: scant discharge without odor,urethra has no lesions or masses noted, cervix:smooth, uterus: normal size, shape and contour, non tender, no masses felt, adnexa: no masses or tenderness noted. Bladder is non tender and no masses felt.   Has lost 4 lbs since visit in January.Reassured that irregular bleeding is not related to infection, continue micronor.   Assessment:    Irregular bleeding(BTB)    Plan:     Continue micronor and metformin  Follow up as scheduled to check A1c   Keep trying for weight loss

## 2015-01-01 ENCOUNTER — Other Ambulatory Visit: Payer: BLUE CROSS/BLUE SHIELD

## 2015-01-01 NOTE — Progress Notes (Signed)
Pt had A1C checked 10/11/14. Pt to come back 01/12/15 to have lab repeated so it will be over 90 days for insurance reasons. Pt voiced understanding and was fine with that. JSY

## 2015-01-12 ENCOUNTER — Other Ambulatory Visit: Payer: BLUE CROSS/BLUE SHIELD

## 2015-01-12 DIAGNOSIS — R7309 Other abnormal glucose: Secondary | ICD-10-CM

## 2015-01-13 LAB — HEMOGLOBIN A1C
Est. average glucose Bld gHb Est-mCnc: 126 mg/dL
Hgb A1c MFr Bld: 6 % — ABNORMAL HIGH (ref 4.8–5.6)

## 2015-01-15 ENCOUNTER — Telehealth: Payer: Self-pay | Admitting: Adult Health

## 2015-01-15 NOTE — Telephone Encounter (Signed)
Left message that A1c 6 which is better was 6.3, 3 months ago.

## 2015-02-13 ENCOUNTER — Telehealth: Payer: Self-pay | Admitting: *Deleted

## 2015-02-13 NOTE — Telephone Encounter (Signed)
Pt is going to stop POP and see what periods do wants to get pregnant, she is aware may need clomid, she is taking prenatal vitamins and is on metformin

## 2015-02-28 ENCOUNTER — Ambulatory Visit (INDEPENDENT_AMBULATORY_CARE_PROVIDER_SITE_OTHER): Payer: BLUE CROSS/BLUE SHIELD | Admitting: Adult Health

## 2015-02-28 ENCOUNTER — Encounter: Payer: Self-pay | Admitting: Adult Health

## 2015-02-28 VITALS — BP 130/84 | HR 76 | Ht 62.0 in | Wt 257.0 lb

## 2015-02-28 DIAGNOSIS — N926 Irregular menstruation, unspecified: Secondary | ICD-10-CM | POA: Diagnosis not present

## 2015-02-28 DIAGNOSIS — Z3202 Encounter for pregnancy test, result negative: Secondary | ICD-10-CM

## 2015-02-28 DIAGNOSIS — Z319 Encounter for procreative management, unspecified: Secondary | ICD-10-CM

## 2015-02-28 DIAGNOSIS — E282 Polycystic ovarian syndrome: Secondary | ICD-10-CM

## 2015-02-28 LAB — POCT URINE PREGNANCY: Preg Test, Ur: NEGATIVE

## 2015-02-28 MED ORDER — CLOMIPHENE CITRATE 50 MG PO TABS: ORAL_TABLET | ORAL | Status: DC

## 2015-02-28 NOTE — Patient Instructions (Signed)
Call with period Take clomid days 3-7 of cycle Check progesterone day 21 fo cycle Have sex every other day, days 7-24, pee before sex and lay there 30 minutes after sex Clomiphene tablets What is this medicine? CLOMIPHENE (KLOE mi feen) is a fertility drug that increases the chance of pregnancy. It helps women ovulate (produce a mature egg) during their cycle. This medicine may be used for other purposes; ask your health care provider or pharmacist if you have questions. COMMON BRAND NAME(S): Clomid, Serophene What should I tell my health care provider before I take this medicine? They need to know if you have any of these conditions: -adrenal gland disease -blood vessel disease or blood clots -cyst on the ovary -endometriosis -liver disease -ovarian cancer -pituitary gland disease -vaginal bleeding that has not been evaluated -an unusual or allergic reaction to clomiphene, other medicines, foods, dyes, or preservatives -pregnant (should not be used if you are already pregnant) -breast-feeding How should I use this medicine? Take this medicine by mouth with a glass of water. Follow the directions on the prescription label. Take exactly as directed for the exact number of days prescribed. Take your doses at regular intervals. Most women take this medicine for a 5 day period, but the length of treatment may be adjusted. Your doctor will give you a start date for this medication and will give you instructions on proper use. Do not take your medicine more often than directed. Talk to your pediatrician regarding the use of this medicine in children. Special care may be needed. Overdosage: If you think you have taken too much of this medicine contact a poison control center or emergency room at once. NOTE: This medicine is only for you. Do not share this medicine with others. What if I miss a dose? If you miss a dose, take it as soon as you can. If it is almost time for your next dose, take  only that dose. Do not take double or extra doses. What may interact with this medicine? -herbal or dietary supplements, like blue cohosh, black cohosh, chasteberry, or DHEA -prasterone This list may not describe all possible interactions. Give your health care provider a list of all the medicines, herbs, non-prescription drugs, or dietary supplements you use. Also tell them if you smoke, drink alcohol, or use illegal drugs. Some items may interact with your medicine. What should I watch for while using this medicine? Make sure you understand how and when to use this medicine. You need to know when you are ovulating and when to have sexual intercourse. This will increase the chance of a pregnancy. Visit your doctor or health care professional for regular checks on your progress. You may need tests to check the hormone levels in your blood or you may have to use home-urine tests to check for ovulation. Try to keep any appointments. Compared to other fertility treatments, this medicine does not greatly increase your chances of having multiple babies. An increased chance of having twins may occur in roughly 5 out of every 100 women who take this medication. Stop taking this medicine at once and contact your doctor or health care professional if you think you are pregnant. This medicine is not for long-term use. Most women that benefit from this medicine do so within the first three cycles (months). Your doctor or health care professional will monitor your condition. This medicine is usually used for a total of 6 cycles of treatment. You may get drowsy or dizzy. Do not  drive, use machinery, or do anything that needs mental alertness until you know how this drug affects you. Do not stand or sit up quickly. This reduces the risk of dizzy or fainting spells. Drinking alcoholic beverages or smoking tobacco may decrease your chance of becoming pregnant. Limit or stop alcohol and tobacco use during your fertility  treatments. What side effects may I notice from receiving this medicine? Side effects that you should report to your doctor or health care professional as soon as possible: -allergic reactions like skin rash, itching or hives, swelling of the face, lips, or tongue -breathing problems -changes in vision -fluid retention -nausea, vomiting -pelvic pain or bloating -severe abdominal pain -sudden weight gain Side effects that usually do not require medical attention (report to your doctor or health care professional if they continue or are bothersome): -breast discomfort -hot flashes -mild pelvic discomfort -mild nausea This list may not describe all possible side effects. Call your doctor for medical advice about side effects. You may report side effects to FDA at 1-800-FDA-1088. Where should I keep my medicine? Keep out of the reach of children. Store at room temperature between 15 and 30 degrees C (59 and 86 degrees F). Protect from heat, light, and moisture. Throw away any unused medicine after the expiration date. NOTE: This sheet is a summary. It may not cover all possible information. If you have questions about this medicine, talk to your doctor, pharmacist, or health care provider.  2015, Elsevier/Gold Standard. (2007-12-13 22:21:06) Preparing for Pregnancy Before trying to become pregnant, make an appointment with your health care provider (preconception care). The goal is to help you have a healthy, safe pregnancy. At your first appointment, your health care provider will:   Do a complete physical exam, including a Pap test.  Take a complete medical history.  Give you advice and help you resolve any problems. PRECONCEPTION CHECKLIST Here is a list of the basics to cover with your health care provider at your preconception visit:  Medical history.  Tell your health care provider about any diseases you have had. Many diseases can affect your pregnancy.  Include your  partner's medical history and family history.  Make sure you have been tested for sexually transmitted infections (STIs). These can affect your pregnancy. In some cases, they can be passed to your baby. Tell your health care provider about any history of STIs.  Make sure your health care provider knows about any previous problems you have had with conception or pregnancy.  Tell your health care provider about any medicine you take. This includes herbal supplements and over-the-counter medicines.  Make sure all your immunizations are up to date. You may need to make additional appointments.  Ask your health care provider if you need any vaccinations or if there are any you should avoid.  Diet.  It is especially important to eat a healthy, balanced diet with the right nutrients when you are pregnant.  Ask your health care provider to help you get to a healthy weight before pregnancy.  If you are overweight, you are at higher risk for certain complications. These include high blood pressure, diabetes, and preterm birth.  If you are underweight, you are more likely to have a low-birth-weight baby.  Lifestyle.  Tell your health care provider about lifestyle factors such as alcohol use, drug use, or smoking.  Describe any harmful substances you may be exposed to at work or home. These can include chemicals, pesticides, and radiation.  Mental health.  Let your health care provider know if you have been feeling depressed or anxious.  Let your health care provider know if you have a history of substance abuse.  Let your health care provider know if you do not feel safe at home. HOME INSTRUCTIONS TO PREPARE FOR PREGNANCY Follow your health care provider's advice and instructions.   Keep an accurate record of your menstrual periods. This makes it easier for your health care provider to determine your baby's due date.  Begin taking prenatal vitamins and folic acid supplements daily. Take  them as directed by your health care provider.  Eat a balanced diet. Get help from a nutrition counselor if you have questions or need help.  Get regular exercise. Try to be active for at least 30 minutes a day most days of the week.  Quit smoking, if you smoke.  Do not drink alcohol.  Do not take illegal drugs.  Get medical problems, such as diabetes or high blood pressure, under control.  If you have diabetes, make sure you do the following:  Have good blood sugar control. If you have type 1 diabetes, use multiple daily doses of insulin. Do not use split-dose or premixed insulin.  Have an eye exam by a qualified eye care professional trained in caring for people with diabetes.  Get evaluated by your health care provider for cardiovascular disease.  Get to a healthy weight. If you are overweight or obese, reduce your weight with the help of a qualified health professional such as a Museum/gallery exhibitions officer. Ask your health care provider what the right weight range is for you. HOW DO I KNOW I AM PREGNANT? You may be pregnant if you have been sexually active and you miss your period. Symptoms of early pregnancy include:   Mild cramping.  Very light vaginal bleeding (spotting).  Feeling unusually tired.  Morning sickness. If you have any of these symptoms, take a home pregnancy test. These tests look for a hormone called human chorionic gonadotropin (hCG) in your urine. Your body begins to make this hormone during early pregnancy. These tests are very accurate. Wait until at least the first day you miss your period to take one. If you get a positive result, call your health care provider to make appointments for prenatal care. WHAT SHOULD I DO IF I BECOME PREGNANT?  Make an appointment with your health care provider by week 12 of your pregnancy at the latest.  Do not smoke. Smoking can be harmful to your baby.  Do not drink alcoholic beverages. Alcohol is related to a number of  birth defects.  Avoid toxic odors and chemicals.  You may continue to have sexual intercourse if it does not cause pain or other problems, such as vaginal bleeding. Document Released: 08/14/2008 Document Revised: 01/16/2014 Document Reviewed: 08/08/2013 [facility] Patient Information 2015 [place], [place]. This information is not intended to replace advice given to you by your health care provider. Make sure you discuss any questions you have with your health care provider.

## 2015-02-28 NOTE — Progress Notes (Signed)
Subjective:     Patient ID: Cassandra [name], female   DOB: Apr 01, 1991, 24 y.o.   MRN: [id number]  HPI Cassandra [name] is a 24 year old white female, married, in to discuss starting clomid to get pregnant.She has irregular periods and history of PCO.She stopped Micronor, May 31,2016.Last A1c 6 in May.  Review of Systems Patient denies any headaches, hearing loss, fatigue, blurred vision, shortness of breath, chest pain, abdominal pain, problems with bowel movements, urination, or intercourse. No joint pain or mood swings.Desires pregnancy, has irregular periods and PCO.  Reviewed past medical,surgical, social and family history. Reviewed medications and allergies.     Objective:   Physical Exam BP 130/84 mmHg  Pulse 76  Ht 5\' 2"  (1.575 m)  Wt 257 lb (116.574 kg)  BMI 46.99 kg/m2  LMP 01/29/2015 UPT negative, Skin warm and dry. Neck: mid line trachea, normal thyroid, good ROM, no lymphadenopathy noted. Lungs: clear to ausculation bilaterally. Cardiovascular: regular rate and rhythm.She is aware about 7% chance of twins and she is fine her mom is a twin.    Assessment:     Pt desires pregnancy PCO Irregular periods    Plan:    Call with period Rx Clomid 50 mg #5 with 2 refills Start clomid on day 3 of cycle and take days 3-7 of cycle and have sex every other day, days 7-24 of cycle, check progesterone level day 21 Review handout on clomid and preparing for pregnancy   Take prenatal vitamin and metformin

## 2015-03-05 ENCOUNTER — Telehealth: Payer: Self-pay | Admitting: Adult Health

## 2015-03-05 DIAGNOSIS — Z319 Encounter for procreative management, unspecified: Secondary | ICD-10-CM

## 2015-03-05 NOTE — Telephone Encounter (Signed)
Spoke with pt. Pt was supposed to start period on Saturday, but she didn't start. Pt took a pregnancy test and it was negative. Please advise. Thanks!! JSY

## 2015-03-05 NOTE — Telephone Encounter (Signed)
Pt did not start period, had negative HPT, start clomid today, have sex day 7-24 check progesterone level July 8th, discussed with Dr Emelda Fear and he agrees with this plan

## 2015-03-07 ENCOUNTER — Telehealth: Payer: Self-pay | Admitting: *Deleted

## 2015-03-07 NOTE — Telephone Encounter (Signed)
Spoke with pt letting her know Progesterone order is ready for 03/23/15. JSY

## 2015-03-08 ENCOUNTER — Telehealth: Payer: Self-pay | Admitting: Adult Health

## 2015-03-08 NOTE — Telephone Encounter (Signed)
Spoke with pt letting her know she can go anytime between 8 am and 5 pm and get lab drawn. Pt voiced understanding. JSY

## 2015-03-24 LAB — PROGESTERONE: Progesterone: 0.4 ng/mL

## 2015-03-26 ENCOUNTER — Telehealth: Payer: Self-pay | Admitting: Adult Health

## 2015-03-26 NOTE — Telephone Encounter (Signed)
Left message progesterone level 0.4 and she did not ovulate, call with period or not

## 2015-04-02 ENCOUNTER — Telehealth: Payer: Self-pay | Admitting: Adult Health

## 2015-04-02 DIAGNOSIS — Z319 Encounter for procreative management, unspecified: Secondary | ICD-10-CM

## 2015-04-02 MED ORDER — CLOMIPHENE CITRATE 50 MG PO TABS: ORAL_TABLET | ORAL | Status: DC

## 2015-04-02 NOTE — Telephone Encounter (Signed)
Will increase clomid to 100 mg daily for 5 days, check progesterone level 8/8

## 2015-04-02 NOTE — Telephone Encounter (Signed)
Spoke with pt. Pt states she hasn't started period. She had a neg pregnancy test. Please advise. Thanks! JSY

## 2015-04-23 ENCOUNTER — Other Ambulatory Visit: Payer: Self-pay | Admitting: Adult Health

## 2015-04-24 ENCOUNTER — Telehealth: Payer: Self-pay | Admitting: Adult Health

## 2015-04-24 LAB — PROGESTERONE: Progesterone: 0.4 ng/mL

## 2015-04-24 NOTE — Telephone Encounter (Signed)
Pt aware of progesterone level and that she did not ovulate, will make appt with Dr Despina Hidden, may need to see [facility]

## 2015-05-15 ENCOUNTER — Ambulatory Visit: Payer: BLUE CROSS/BLUE SHIELD | Admitting: Obstetrics & Gynecology

## 2015-05-16 ENCOUNTER — Encounter: Payer: Self-pay | Admitting: Obstetrics & Gynecology

## 2015-05-16 ENCOUNTER — Ambulatory Visit (INDEPENDENT_AMBULATORY_CARE_PROVIDER_SITE_OTHER): Payer: BLUE CROSS/BLUE SHIELD | Admitting: Obstetrics & Gynecology

## 2015-05-16 VITALS — BP 110/70 | HR 72 | Wt 209.0 lb

## 2015-05-16 DIAGNOSIS — E282 Polycystic ovarian syndrome: Secondary | ICD-10-CM

## 2015-05-16 DIAGNOSIS — N970 Female infertility associated with anovulation: Secondary | ICD-10-CM

## 2015-05-16 MED ORDER — MEDROXYPROGESTERONE ACETATE 10 MG PO TABS: 10.0000 mg | ORAL_TABLET | Freq: Every day | ORAL | Status: DC

## 2015-05-16 MED ORDER — CLOMIPHENE CITRATE 50 MG PO TABS: ORAL_TABLET | ORAL | Status: DC

## 2015-05-16 NOTE — Progress Notes (Signed)
Patient ID: Cassandra [name], female   DOB: 1990-10-30, 24 y.o.   MRN: [id number] Chief Complaint  Patient presents with  . Follow-up    was taking clomid x 2 /still did't ovulate.    Blood pressure 110/70, pulse 72, weight 209 lb (94.802 kg).  24 y.o. G0P0 No LMP recorded. The current method of family planning is none.  Subjective Anovulatory/PCOS well known patient On metformin has been on clomid 100 mg daily with no ovulation  Will bump up to 150 mg daily for 5 days after provera x 10 days  Objective   Pertinent ROS   Labs or studies Progesterone indicates no ovulation    Impression Diagnoses this Encounter::   ICD-9-CM ICD-10-CM   1. Anovulation 628.0 N97.0   2. PCO (polycystic ovaries) 256.4 E28.2     Established relevant diagnosis(es):   Plan/Recommendations: Meds ordered this encounter  Medications  . medroxyPROGESTERone (PROVERA) 10 MG tablet    Sig: Take 1 tablet (10 mg total) by mouth daily.    Dispense:  10 tablet    Refill:  3  . clomiPHENE (CLOMID) 50 MG tablet    Sig: Take 3 daily for 5 days    Dispense:  15 tablet    Refill:  1   Continue metformin as well  Labs or Scans Ordered: No orders of the defined types were placed in this encounter.    If unsucessful at 200 mg then will need evaluation by [facility]  Follow up Return if symptoms worsen or fail to improve, for Follow up, with Dr Despina Hidden.      Face to face time:  15 minutes  Greater than 50% of the visit time was spent in counseling and coordination of care with the patient.  The summary and outline of the counseling and care coordination is summarized in the note above.   All questions were answered.

## 2015-05-18 ENCOUNTER — Telehealth: Payer: Self-pay | Admitting: *Deleted

## 2015-05-18 NOTE — Telephone Encounter (Signed)
Pt states she is taking Provera for infertility and was to start Clomid the day she started her period. Does she need to take the Provera and take Clomid at the same time once she starts her period?   Also how often should she have sex after she ovulates? Pt states she is at work, can leave voice message at [phone] with instructions. Pt state "she is just confused."

## 2015-05-18 NOTE — Telephone Encounter (Signed)
No take the provera for 10 days only then stop the provera Start the clomid the day the bleeding starts

## 2015-05-18 NOTE — Telephone Encounter (Signed)
Left message per pt consent stating for pt to take provera x 10 days the stop, start clomid the day her periods start.

## 2015-05-30 ENCOUNTER — Telehealth: Payer: Self-pay | Admitting: Obstetrics & Gynecology

## 2015-05-30 NOTE — Telephone Encounter (Signed)
Pt states started clomid today with beginning of period, was told needed to let Dr. Despina Hidden know. When does pt need to take ovulation test?

## 2015-05-30 NOTE — Telephone Encounter (Signed)
The patient needs to come in for a gyn sonogram on 06/11/2015

## 2015-05-31 ENCOUNTER — Telehealth: Payer: Self-pay | Admitting: Obstetrics & Gynecology

## 2015-05-31 NOTE — Telephone Encounter (Signed)
Pt informed per Cyril Mourning, NP to start having sex on day 7-24 of every other day while on her cycle. Pt taking clomid.

## 2015-05-31 NOTE — Telephone Encounter (Signed)
Ultrasound appt scheduled for 06/14/2015 per Dr. Despina Hidden.

## 2015-06-07 ENCOUNTER — Other Ambulatory Visit: Payer: Self-pay | Admitting: Obstetrics & Gynecology

## 2015-06-07 DIAGNOSIS — Z319 Encounter for procreative management, unspecified: Secondary | ICD-10-CM

## 2015-06-11 ENCOUNTER — Ambulatory Visit (INDEPENDENT_AMBULATORY_CARE_PROVIDER_SITE_OTHER): Payer: BLUE CROSS/BLUE SHIELD

## 2015-06-11 DIAGNOSIS — Z319 Encounter for procreative management, unspecified: Secondary | ICD-10-CM | POA: Diagnosis not present

## 2015-06-11 NOTE — Progress Notes (Addendum)
TV PELVIC US: homogenous anteverted uterus,EEC 9 mm,normal ov's bilat mult. small peripheral follicles,Rt ov largest follicle 1.1 x.6x1.2cm,no cul de sac fluid seen,Pt started Clomid on 05/30/2015

## 2015-06-14 ENCOUNTER — Telehealth: Payer: Self-pay | Admitting: Obstetrics & Gynecology

## 2015-06-15 NOTE — Telephone Encounter (Signed)
Her sonogram does not show any dominant follicles so will not ovulate, I have a call in to a reproductive colleague and awaiting his call back

## 2015-06-18 NOTE — Telephone Encounter (Signed)
Pt informed of U/S results per Dr. Despina Hidden and awaiting a call back from reproductive colleague. Pt verbalized understanding.

## 2016-06-10 ENCOUNTER — Encounter: Payer: Self-pay | Admitting: *Deleted

## 2016-06-11 ENCOUNTER — Encounter: Payer: Self-pay | Admitting: Women's Health

## 2016-06-11 ENCOUNTER — Ambulatory Visit (INDEPENDENT_AMBULATORY_CARE_PROVIDER_SITE_OTHER): Payer: BLUE CROSS/BLUE SHIELD | Admitting: Women's Health

## 2016-06-11 VITALS — BP 118/70 | HR 88 | Wt 255.0 lb

## 2016-06-11 DIAGNOSIS — Z23 Encounter for immunization: Secondary | ICD-10-CM

## 2016-06-11 DIAGNOSIS — O0990 Supervision of high risk pregnancy, unspecified, unspecified trimester: Secondary | ICD-10-CM | POA: Insufficient documentation

## 2016-06-11 DIAGNOSIS — Z331 Pregnant state, incidental: Secondary | ICD-10-CM

## 2016-06-11 DIAGNOSIS — Z3682 Encounter for antenatal screening for nuchal translucency: Secondary | ICD-10-CM

## 2016-06-11 DIAGNOSIS — Z3401 Encounter for supervision of normal first pregnancy, first trimester: Secondary | ICD-10-CM | POA: Diagnosis not present

## 2016-06-11 DIAGNOSIS — Z0283 Encounter for blood-alcohol and blood-drug test: Secondary | ICD-10-CM

## 2016-06-11 DIAGNOSIS — Z369 Encounter for antenatal screening, unspecified: Secondary | ICD-10-CM

## 2016-06-11 DIAGNOSIS — Z1389 Encounter for screening for other disorder: Secondary | ICD-10-CM

## 2016-06-11 LAB — POCT URINALYSIS DIPSTICK
Blood, UA: NEGATIVE
Glucose, UA: NEGATIVE
KETONES UA: NEGATIVE
LEUKOCYTES UA: NEGATIVE
Nitrite, UA: NEGATIVE
Protein, UA: NEGATIVE

## 2016-06-11 NOTE — Addendum Note (Signed)
Addended by: Gaylyn RongEVANS, Zavier Canela A on: 06/11/2016 04:45 PM   Modules accepted: Orders

## 2016-06-11 NOTE — Progress Notes (Signed)
Subjective:  Cassandra [name] is a 25 y.o. G1P0 Caucasian female at [redacted]w[redacted]d by LMP c/w 5wk u/s, being seen today for her first obstetrical visit.  Her obstetrical history is significant for infertility- conceived via IUI on 04/12/16 w/ [facility], was alson on Femara and metformin.  Started infertility tx w/ [place] w/ clomid, got up to 150mg - then went to Dr. Billy Coast, then [facility]. Pregnancy history fully reviewed. H/O PCOS  Patient reports some lower bilateral side abd pain- feels like pulling. Denies vb, cramping, uti s/s, abnormal/malodorous vag d/c, or vulvovaginal itching/irritation.  BP 118/70   Pulse 88   Wt 255 lb (115.7 kg)   LMP 03/29/2016 (Exact Date)   BMI 46.64 kg/m   HISTORY: OB History  Gravida Para Term Preterm AB Living  1            SAB TAB Ectopic Multiple Live Births               # Outcome Date GA Lbr Len/2nd Weight Sex Delivery Anes PTL Lv  1 Current              Past Medical History:  Diagnosis Date  . Anovulation   . Bipolar 1 disorder (HCC)    no medication  . Contraceptive management 11/22/2013  . Encounter for Implanon removal 11/22/2013   Could not remove today will try again in 2 weeks, has ben in since 2010 and pt has gained weight  . Hx of migraines   . Infertility, female   . Irregular intermenstrual bleeding 12/13/2014  . Irregular periods 09/26/2014   Spots no true flow in 6 years  . Obesity   . PCO (polycystic ovaries) 10/05/2014   Past Surgical History:  Procedure Laterality Date  . WISDOM TOOTH EXTRACTION     Family History  Problem Relation Age of Onset  . Cancer Maternal Grandmother     stomach  . Cancer Maternal Grandfather     lung  . COPD Mother   . Emphysema Mother   . Hypertension Mother     Exam   System:     General: Well developed & nourished, no acute distress   Skin: Warm & dry, normal coloration and turgor, no rashes   Neurologic: Alert & oriented, normal mood   Cardiovascular: Regular rate & rhythm    Respiratory: Effort & rate normal, LCTAB, acyanotic   Abdomen: Soft, non tender   Extremities: normal strength, tone  Thin prep pap smear neg 12/25/14  FHR: 170 via doppler   Assessment:   Pregnancy: G1P0 Patient Active Problem List   Diagnosis Date Noted  . Supervision of normal first pregnancy 06/11/2016  . PCO (polycystic ovaries) 10/05/2014  . Irregular periods 09/26/2014  . Encounter for Implanon removal 11/22/2013  . NAUSEA 10/15/2010  . ABDOMINAL PAIN-MULTIPLE SITES 10/15/2010    [redacted]w[redacted]d G1P0 New OB visit Obesity H/O infertility Pregnancy result of IUI, femara, metformin PCOS  Plan:  Initial labs drawn- can get w/ early 2hr GTT Continue prenatal vitamins Problem list reviewed and updated Reviewed n/v relief measures and warning s/s to report Reviewed recommended weight gain based on pre-gravid BMI Encouraged well-balanced diet Genetic Screening discussed Integrated Screen: requested Cystic fibrosis screening discussed thinks they tested her for this at infertility clinic- if not, wants Ultrasound discussed; fetal survey: requested Follow up this week for early 2hr gtt and PN1, then in 2 weeks for 1st it/nt and visit Per [facility] can stop metformin [facility]NC completed Flu shot today Reviewed  [facility]- only have 1 visit, no u/s  Marge DuncansBooker, Emelina Hinch Randall CNM, [facility]NP-BC 06/11/2016 3:52 PM

## 2016-06-11 NOTE — Patient Instructions (Addendum)
Stop Metformin  You will have your sugar test next visit.  Please do not eat or drink anything after midnight the night before you come, not even water.  You will be here for at least two hours.     Nausea & Vomiting  Have saltine crackers or pretzels by your bed and eat a few bites before you raise your head out of bed in the morning  Eat small frequent meals throughout the day instead of large meals  Drink plenty of fluids throughout the day to stay hydrated, just don't drink a lot of fluids with your meals.  This can make your stomach fill up faster making you feel sick  Do not brush your teeth right after you eat  Products with real ginger are good for nausea, like ginger ale and ginger hard candy Make sure it says made with real ginger!  Sucking on sour candy like lemon heads is also good for nausea  If your prenatal vitamins make you nauseated, take them at night so you will sleep through the nausea  Sea Bands  If you feel like you need medicine for the nausea & vomiting please let us know  If you are unable to keep any fluids or food down please let us know   Constipation  Drink plenty of fluid, preferably water, throughout the day  Eat foods high in fiber such as fruits, vegetables, and grains  Exercise, such as walking, is a good way to keep your bowels regular  Drink warm fluids, especially warm prune juice, or decaf coffee  Eat a 1/2 cup of real oatmeal (not instant), 1/2 cup applesauce, and 1/2-1 cup warm prune juice every day  If needed, you may take Colace (docusate sodium) stool softener once or twice a day to help keep the stool soft. If you are pregnant, wait until you are out of your first trimester (12-14 weeks of pregnancy)  If you still are having problems with constipation, you may take Miralax once daily as needed to help keep your bowels regular.  If you are pregnant, wait until you are out of your first trimester (12-14 weeks of pregnancy)   First  Trimester of Pregnancy The first trimester of pregnancy is from week 1 until the end of week 12 (months 1 through 3). A week after a sperm fertilizes an egg, the egg will implant on the wall of the uterus. This embryo will begin to develop into a baby. Genes from you and your partner are forming the baby. The female genes determine whether the baby is a boy or a girl. At 6-8 weeks, the eyes and face are formed, and the heartbeat can be seen on ultrasound. At the end of 12 weeks, all the baby's organs are formed.  Now that you are pregnant, you will want to do everything you can to have a healthy baby. Two of the most important things are to get good prenatal care and to follow your health care provider's instructions. Prenatal care is all the medical care you receive before the baby's birth. This care will help prevent, find, and treat any problems during the pregnancy and childbirth. BODY CHANGES Your body goes through many changes during pregnancy. The changes vary from woman to woman.   You may gain or lose a couple of pounds at first.  You may feel sick to your stomach (nauseous) and throw up (vomit). If the vomiting is uncontrollable, call your health care provider.  You may tire easily.  You may develop headaches that can be relieved by medicines approved by your health care provider.  You may urinate more often. Painful urination may mean you have a bladder infection.  You may develop heartburn as a result of your pregnancy.  You may develop constipation because certain hormones are causing the muscles that push waste through your intestines to slow down.  You may develop hemorrhoids or swollen, bulging veins (varicose veins).  Your breasts may begin to grow larger and become tender. Your nipples may stick out more, and the tissue that surrounds them (areola) may become darker.  Your gums may bleed and may be sensitive to brushing and flossing.  Dark spots or blotches (chloasma, mask  of pregnancy) may develop on your face. This will likely fade after the baby is born.  Your menstrual periods will stop.  You may have a loss of appetite.  You may develop cravings for certain kinds of food.  You may have changes in your emotions from day to day, such as being excited to be pregnant or being concerned that something may go wrong with the pregnancy and baby.  You may have more vivid and strange dreams.  You may have changes in your hair. These can include thickening of your hair, rapid growth, and changes in texture. Some women also have hair loss during or after pregnancy, or hair that feels dry or thin. Your hair will most likely return to normal after your baby is born. WHAT TO EXPECT AT YOUR PRENATAL VISITS During a routine prenatal visit:  You will be weighed to make sure you and the baby are growing normally.  Your blood pressure will be taken.  Your abdomen will be measured to track your baby's growth.  The fetal heartbeat will be listened to starting around week 10 or 12 of your pregnancy.  Test results from any previous visits will be discussed. Your health care provider may ask you:  How you are feeling.  If you are feeling the baby move.  If you have had any abnormal symptoms, such as leaking fluid, bleeding, severe headaches, or abdominal cramping.  If you have any questions. Other tests that may be performed during your first trimester include:  Blood tests to find your blood type and to check for the presence of any previous infections. They will also be used to check for low iron levels (anemia) and Rh antibodies. Later in the pregnancy, blood tests for diabetes will be done along with other tests if problems develop.  Urine tests to check for infections, diabetes, or protein in the urine.  An ultrasound to confirm the proper growth and development of the baby.  An amniocentesis to check for possible genetic problems.  Fetal screens for spina  bifida and Down syndrome.  You may need other tests to make sure you and the baby are doing well. HOME CARE INSTRUCTIONS  Medicines  Follow your health care provider's instructions regarding medicine use. Specific medicines may be either safe or unsafe to take during pregnancy.  Take your prenatal vitamins as directed.  If you develop constipation, try taking a stool softener if your health care provider approves. Diet  Eat regular, well-balanced meals. Choose a variety of foods, such as meat or vegetable-based protein, fish, milk and low-fat dairy products, vegetables, fruits, and whole grain breads and cereals. Your health care provider will help you determine the amount of weight gain that is right for you.  Avoid raw meat and uncooked cheese. These  carry germs that can cause birth defects in the baby.  Eating four or five small meals rather than three large meals a day may help relieve nausea and vomiting. If you start to feel nauseous, eating a few soda crackers can be helpful. Drinking liquids between meals instead of during meals also seems to help nausea and vomiting.  If you develop constipation, eat more high-fiber foods, such as fresh vegetables or fruit and whole grains. Drink enough fluids to keep your urine clear or pale yellow. Activity and Exercise  Exercise only as directed by your health care provider. Exercising will help you:  Control your weight.  Stay in shape.  Be prepared for labor and delivery.  Experiencing pain or cramping in the lower abdomen or low back is a good sign that you should stop exercising. Check with your health care provider before continuing normal exercises.  Try to avoid standing for long periods of time. Move your legs often if you must stand in one place for a long time.  Avoid heavy lifting.  Wear low-heeled shoes, and practice good posture.  You may continue to have sex unless your health care provider directs you  otherwise. Relief of Pain or Discomfort  Wear a good support bra for breast tenderness.   Take warm sitz baths to soothe any pain or discomfort caused by hemorrhoids. Use hemorrhoid cream if your health care provider approves.   Rest with your legs elevated if you have leg cramps or low back pain.  If you develop varicose veins in your legs, wear support hose. Elevate your feet for 15 minutes, 3-4 times a day. Limit salt in your diet. Prenatal Care  Schedule your prenatal visits by the twelfth week of pregnancy. They are usually scheduled monthly at first, then more often in the last 2 months before delivery.  Write down your questions. Take them to your prenatal visits.  Keep all your prenatal visits as directed by your health care provider. Safety  Wear your seat belt at all times when driving.  Make a list of emergency phone numbers, including numbers for family, friends, the hospital, and police and fire departments. General Tips  Ask your health care provider for a referral to a local prenatal education class. Begin classes no later than at the beginning of month 6 of your pregnancy.  Ask for help if you have counseling or nutritional needs during pregnancy. Your health care provider can offer advice or refer you to specialists for help with various needs.  Do not use hot tubs, steam rooms, or saunas.  Do not douche or use tampons or scented sanitary pads.  Do not cross your legs for long periods of time.  Avoid cat litter boxes and soil used by cats. These carry germs that can cause birth defects in the baby and possibly loss of the fetus by miscarriage or stillbirth.  Avoid all smoking, herbs, alcohol, and medicines not prescribed by your health care provider. Chemicals in these affect the formation and growth of the baby.  Schedule a dentist appointment. At home, brush your teeth with a soft toothbrush and be gentle when you floss. SEEK MEDICAL CARE IF:   You have  dizziness.  You have mild pelvic cramps, pelvic pressure, or nagging pain in the abdominal area.  You have persistent nausea, vomiting, or diarrhea.  You have a bad smelling vaginal discharge.  You have pain with urination.  You notice increased swelling in your face, hands, legs, or ankles. SEEK  IMMEDIATE MEDICAL CARE IF:   You have a fever.  You are leaking fluid from your vagina.  You have spotting or bleeding from your vagina.  You have severe abdominal cramping or pain.  You have rapid weight gain or loss.  You vomit blood or material that looks like coffee grounds.  You are exposed to [place] measles and have never had them.  You are exposed to fifth disease or chickenpox.  You develop a severe headache.  You have shortness of breath.  You have any kind of trauma, such as from a fall or a car accident. Document Released: 08/26/2001 Document Revised: 01/16/2014 Document Reviewed: 07/12/2013 [facility] Patient Information 2015 [place], [place]. This information is not intended to replace advice given to you by your health care provider. Make sure you discuss any questions you have with your health care provider.

## 2016-06-12 ENCOUNTER — Other Ambulatory Visit: Payer: BLUE CROSS/BLUE SHIELD

## 2016-06-12 DIAGNOSIS — Z131 Encounter for screening for diabetes mellitus: Secondary | ICD-10-CM

## 2016-06-12 LAB — URINALYSIS, ROUTINE W REFLEX MICROSCOPIC
BILIRUBIN UA: NEGATIVE
GLUCOSE, UA: NEGATIVE
Ketones, UA: NEGATIVE
Leukocytes, UA: NEGATIVE
NITRITE UA: NEGATIVE
PH UA: 7 (ref 5.0–7.5)
PROTEIN UA: NEGATIVE
RBC UA: NEGATIVE
Specific Gravity, UA: 1.022 (ref 1.005–1.030)
UUROB: 0.2 mg/dL (ref 0.2–1.0)

## 2016-06-12 LAB — PMP SCREEN PROFILE (10S), URINE
AMPHETAMINE SCRN UR: NEGATIVE ng/mL
Barbiturate Screen, Ur: NEGATIVE ng/mL
Benzodiazepine Screen, Urine: NEGATIVE ng/mL
CANNABINOIDS UR QL SCN: NEGATIVE ng/mL
COCAINE(METAB.) SCREEN, URINE: NEGATIVE ng/mL
Creatinine(Crt), U: 98.6 mg/dL (ref 20.0–300.0)
METHADONE SCREEN, URINE: NEGATIVE ng/mL
OXYCODONE+OXYMORPHONE UR QL SCN: NEGATIVE ng/mL
Opiate Scrn, Ur: NEGATIVE ng/mL
PCP SCRN UR: NEGATIVE ng/mL
PH UR, DRUG SCRN: 6.6 (ref 4.5–8.9)
PROPOXYPHENE SCREEN: NEGATIVE ng/mL

## 2016-06-13 LAB — HEPATITIS B SURFACE ANTIGEN: Hepatitis B Surface Ag: NEGATIVE

## 2016-06-13 LAB — GC/CHLAMYDIA PROBE AMP
Chlamydia trachomatis, NAA: NEGATIVE
NEISSERIA GONORRHOEAE BY PCR: NEGATIVE

## 2016-06-13 LAB — CBC
HEMATOCRIT: 42.3 % (ref 34.0–46.6)
Hemoglobin: 14.4 g/dL (ref 11.1–15.9)
MCH: 29.6 pg (ref 26.6–33.0)
MCHC: 34 g/dL (ref 31.5–35.7)
MCV: 87 fL (ref 79–97)
Platelets: 262 10*3/uL (ref 150–379)
RBC: 4.86 x10E6/uL (ref 3.77–5.28)
RDW: 13.5 % (ref 12.3–15.4)
WBC: 11.4 10*3/uL — AB (ref 3.4–10.8)

## 2016-06-13 LAB — RUBELLA SCREEN: Rubella Antibodies, IGG: 2.09 index (ref >0.99)

## 2016-06-13 LAB — RPR: RPR: NONREACTIVE

## 2016-06-13 LAB — URINE CULTURE: Organism ID, Bacteria: NO GROWTH

## 2016-06-13 LAB — ABO/RH: RH TYPE: POSITIVE

## 2016-06-13 LAB — ANTIBODY SCREEN: ANTIBODY SCREEN: NEGATIVE

## 2016-06-13 LAB — GLUCOSE TOLERANCE, 2 HOURS W/ 1HR
GLUCOSE, 2 HOUR: 186 mg/dL — AB (ref 65–152)
GLUCOSE, FASTING: 83 mg/dL (ref 65–91)
Glucose, 1 hour: 189 mg/dL — ABNORMAL HIGH (ref 65–179)

## 2016-06-13 LAB — VARICELLA ZOSTER ANTIBODY, IGG: Varicella zoster IgG: 1279 index (ref >165)

## 2016-06-13 LAB — HIV ANTIBODY (ROUTINE TESTING W REFLEX): HIV Screen 4th Generation wRfx: NONREACTIVE

## 2016-06-14 ENCOUNTER — Encounter: Payer: Self-pay | Admitting: Women's Health

## 2016-06-14 DIAGNOSIS — Z8632 Personal history of gestational diabetes: Secondary | ICD-10-CM | POA: Insufficient documentation

## 2016-06-16 ENCOUNTER — Telehealth: Payer: Self-pay | Admitting: Women's Health

## 2016-06-16 DIAGNOSIS — O24410 Gestational diabetes mellitus in pregnancy, diet controlled: Secondary | ICD-10-CM

## 2016-06-16 LAB — SPECIMEN STATUS REPORT

## 2016-06-16 NOTE — Telephone Encounter (Signed)
Called pt, notified of GDM, referral to dietician sent, call if doesn't hear from her in next 2wks, bring log to each appt.  Cheral MarkerKimberly R. Karyna Bessler, CNM, [facility]NP-BC 06/16/2016 1:39 PM

## 2016-06-21 LAB — HGB A1C W/O EAG: Hgb A1c MFr Bld: 5.3 % (ref 4.8–5.6)

## 2016-06-21 LAB — SPECIMEN STATUS REPORT

## 2016-06-23 ENCOUNTER — Encounter: Payer: Medicaid Other | Attending: Obstetrics and Gynecology | Admitting: Skilled Nursing Facility1

## 2016-06-23 DIAGNOSIS — O24410 Gestational diabetes mellitus in pregnancy, diet controlled: Secondary | ICD-10-CM | POA: Insufficient documentation

## 2016-06-23 DIAGNOSIS — Z3A10 10 weeks gestation of pregnancy: Secondary | ICD-10-CM | POA: Insufficient documentation

## 2016-06-24 ENCOUNTER — Telehealth: Payer: Self-pay | Admitting: Advanced Practice Midwife

## 2016-06-24 ENCOUNTER — Encounter: Payer: Self-pay | Admitting: Skilled Nursing Facility1

## 2016-06-24 NOTE — Telephone Encounter (Signed)
Patient called stating [facility] lancets and strips for her Contour Next 1 meter. Patient checks CBG's 4 times per day. Prescription called in to [facility] for #100 for three refills.

## 2016-06-24 NOTE — Progress Notes (Signed)
  Patient was seen on 06/23/2016 for Gestational Diabetes self-management class at [facility]. The following learning objectives were met by the patient during this course:   States the definition of Gestational Diabetes  States why dietary management is important in controlling blood glucose  Describes the effects each nutrient has on blood glucose levels  Demonstrates ability to create a balanced meal plan  Demonstrates carbohydrate counting   States when to check blood glucose levels involving a total of 4 separate occurences in a day  Demonstrates proper blood glucose monitoring techniques  States the effect of stress and exercise on blood glucose levels  States the importance of limiting caffeine and abstaining from alcohol and smoking  Demonstrates the knowledge the glucometer provided in class may not be covered by their insurance and to call their insurance provider immediately after class to know which glucometer their insurance provider does cover as well as calling their physician the next day for a prescription to the glucometer their insurance does cover (if the one provided is not) as well as the lancets and strips for that meter. Pt is in her 12th week of pregnancy and has PCOS.  Blood glucose monitor given: contour one next Lot # [id number] Exp: 09/14/2017 Blood glucose reading: 84  Patient instructed to monitor glucose levels: FBS: 60 - <90 1 hour: <140 2 hour: <120  *Patient received handouts:  Nutrition Diabetes and Pregnancy  Carbohydrate Counting List  Patient will be seen for follow-up as needed.

## 2016-06-25 ENCOUNTER — Ambulatory Visit (INDEPENDENT_AMBULATORY_CARE_PROVIDER_SITE_OTHER): Payer: BLUE CROSS/BLUE SHIELD | Admitting: Advanced Practice Midwife

## 2016-06-25 ENCOUNTER — Ambulatory Visit (INDEPENDENT_AMBULATORY_CARE_PROVIDER_SITE_OTHER): Payer: BLUE CROSS/BLUE SHIELD

## 2016-06-25 ENCOUNTER — Encounter: Payer: Self-pay | Admitting: Advanced Practice Midwife

## 2016-06-25 VITALS — BP 112/62 | HR 70 | Wt 254.0 lb

## 2016-06-25 DIAGNOSIS — Z3682 Encounter for antenatal screening for nuchal translucency: Secondary | ICD-10-CM | POA: Diagnosis not present

## 2016-06-25 DIAGNOSIS — Z1389 Encounter for screening for other disorder: Secondary | ICD-10-CM

## 2016-06-25 DIAGNOSIS — Z3A13 13 weeks gestation of pregnancy: Secondary | ICD-10-CM

## 2016-06-25 DIAGNOSIS — O0971 Supervision of high risk pregnancy due to social problems, first trimester: Secondary | ICD-10-CM

## 2016-06-25 DIAGNOSIS — Z331 Pregnant state, incidental: Secondary | ICD-10-CM

## 2016-06-25 DIAGNOSIS — O09891 Supervision of other high risk pregnancies, first trimester: Secondary | ICD-10-CM

## 2016-06-25 DIAGNOSIS — O24410 Gestational diabetes mellitus in pregnancy, diet controlled: Secondary | ICD-10-CM

## 2016-06-25 LAB — POCT URINALYSIS DIPSTICK
Blood, UA: NEGATIVE
GLUCOSE UA: NEGATIVE
KETONES UA: NEGATIVE
Leukocytes, UA: NEGATIVE
Nitrite, UA: NEGATIVE
Protein, UA: NEGATIVE

## 2016-06-25 NOTE — Progress Notes (Signed)
US 12+4 wks,measurements c/w dates,normal ov's bilat,NB present,NT 1.6 mm,crl 63.810mm,fhr 164 bpm

## 2016-06-25 NOTE — Progress Notes (Signed)
Fetal Surveillance Testing today:  .NT/IT   High Risk Pregnancy Diagnosis(es):   A1/B DM  G1P0 10635w4d Estimated Date of Delivery: 01/03/17  Blood pressure 112/62, pulse 70, weight 254 lb (115.2 kg), last menstrual period 03/29/2016.  Urinalysis: Negative   HPI: The patient is being seen today for ongoing management of the above. . Today she reports FBS:  84, 89.  2 hour pp usually <120   BP weight and urine results all reviewed and noted. Patient denies any bleeding and no rupture of membranes symptoms or regular contractions. :   Edema:  NO  Patient is without complaints other than noted in her HPI. All questions were answered.  All lab and sonogram results have been reviewed. Comments:  US 12+4 wks,measurements c/w dates,normal ov's bilat,NB present,NT 1.6 mm,crl 63.680mm,fhr 164 bpm  Assessment:  1.  Pregnancy at 7835w4d,  Estimated Date of Delivery: 01/03/17 :                          2.  A1/B DM                        3.    Medication(s) Plans:  None now  Treatment Plan:  Continue QID blood sugar testing, EFW 36-38 weeks, IOL 40 weeks   Return in about 4 weeks (around 07/23/2016) for HROB, 2nd IT. for appointment for high risk OB care  Meds ordered this encounter  Medications  . BAYER CONTOUR NEXT TEST test strip    Refill:  2  . MICROLET LANCETS MISC    Refill:  0   Orders Placed This Encounter  Procedures  . Maternal Screen, Integrated #1  . POCT urinalysis dipstick

## 2016-06-28 LAB — MATERNAL SCREEN, INTEGRATED #1
CROWN RUMP LENGTH MAT SCREEN: 63 mm
Gest. Age on Collection Date: 12.6 weeks
Maternal Age at EDD: 25.5 years
Nuchal Translucency (NT): 1.6 mm
Number of Fetuses: 1
PAPP-A Value: 344.8 ng/mL
Weight: 254 [lb_av]

## 2016-07-23 ENCOUNTER — Encounter: Payer: Self-pay | Admitting: Advanced Practice Midwife

## 2016-07-23 ENCOUNTER — Ambulatory Visit (INDEPENDENT_AMBULATORY_CARE_PROVIDER_SITE_OTHER): Payer: BLUE CROSS/BLUE SHIELD | Admitting: Advanced Practice Midwife

## 2016-07-23 VITALS — BP 112/60 | HR 80 | Wt 257.0 lb

## 2016-07-23 DIAGNOSIS — Z3A17 17 weeks gestation of pregnancy: Secondary | ICD-10-CM

## 2016-07-23 DIAGNOSIS — Z1389 Encounter for screening for other disorder: Secondary | ICD-10-CM

## 2016-07-23 DIAGNOSIS — K219 Gastro-esophageal reflux disease without esophagitis: Secondary | ICD-10-CM

## 2016-07-23 DIAGNOSIS — Z331 Pregnant state, incidental: Secondary | ICD-10-CM

## 2016-07-23 DIAGNOSIS — Z3682 Encounter for antenatal screening for nuchal translucency: Secondary | ICD-10-CM

## 2016-07-23 DIAGNOSIS — O09892 Supervision of other high risk pregnancies, second trimester: Secondary | ICD-10-CM

## 2016-07-23 DIAGNOSIS — O24410 Gestational diabetes mellitus in pregnancy, diet controlled: Secondary | ICD-10-CM

## 2016-07-23 LAB — POCT URINALYSIS DIPSTICK
Blood, UA: NEGATIVE
GLUCOSE UA: NEGATIVE
Ketones, UA: NEGATIVE
LEUKOCYTES UA: NEGATIVE
NITRITE UA: NEGATIVE
Protein, UA: NEGATIVE

## 2016-07-23 MED ORDER — GLYBURIDE 2.5 MG PO TABS: 2.5000 mg | ORAL_TABLET | Freq: Every day | ORAL | 3 refills | Status: DC

## 2016-07-23 MED ORDER — OMEPRAZOLE 20 MG PO CPDR: 20.0000 mg | DELAYED_RELEASE_CAPSULE | Freq: Every day | ORAL | 6 refills | Status: DC

## 2016-07-23 MED ORDER — ASPIRIN EC 81 MG PO TBEC: 81.0000 mg | DELAYED_RELEASE_TABLET | Freq: Every day | ORAL | 6 refills | Status: DC

## 2016-07-23 NOTE — Patient Instructions (Signed)

## 2016-07-23 NOTE — Progress Notes (Signed)
Fetal Surveillance Testing today:  doppler   High Risk Pregnancy Diagnosis(es):   A1/B DM; now A2/B  G1P0 4465w4d Estimated Date of Delivery: 01/03/17  Blood pressure 112/60, pulse 80, weight 257 lb (116.6 kg), last menstrual period 03/29/2016.  Urinalysis: Negative   HPI: The patient is being seen today for ongoing management of the above. She has begun having severe reflux where she will have vomit come out of her mouth "just with a burp or sometimes all by itself" Today she reports FBS 90-110 and 2 hr pp 115-216, most in the 135 range.  Admits to drinking sweet tea when the results are >180.  Dangers discussed.    BP weight and urine results all reviewed and noted. Patient reports some fetal movement, denies any bleeding and no rupture of membranes symptoms or regular contractions.   Fetal Heart rate:  150 Edema:  no  Patient is without complaints other than noted in her HPI. All questions were answered.  All lab and sonogram results have been reviewed. Comments:  [facility]  Assessment:  1.  Pregnancy at 6465w4d,  Estimated Date of Delivery: 01/03/17 :                          2.  A/B DM, now A2                        3.    Medication(s) Plans:  Start ASA 81 mg qd and Glyburide 2.5mg  qhs  Treatment Plan:  Send me a mychart message next week with BS results.    Return in about 3 weeks (around 08/13/2016) for HROB, WG:NFAOZHYS:Anatomy. for appointment for high risk OB care  Meds ordered this encounter  Medications  . omeprazole (PRILOSEC) 20 MG capsule    Sig: Take 1 capsule (20 mg total) by mouth daily.    Dispense:  30 capsule    Refill:  6    Order Specific Question:   Supervising Provider    Answer:   Despina HiddenEURE, LUTHER H [[id number]]  . glyBURIDE (DIABETA) 2.5 MG tablet    Sig: Take 1 tablet (2.5 mg total) by mouth at bedtime.    Dispense:  60 tablet    Refill:  3    Order Specific Question:   Supervising Provider    Answer:   Despina HiddenEURE, LUTHER H [[id number]]  . aspirin EC 81 MG  tablet    Sig: Take 1 tablet (81 mg total) by mouth daily.    Dispense:  30 tablet    Refill:  6    Order Specific Question:   Supervising Provider    Answer:   Duane LopeEURE, LUTHER H [[id number]]   Orders Placed This Encounter  Procedures  . Maternal Screen, Integrated #2  . POCT urinalysis dipstick

## 2016-07-25 LAB — MATERNAL SCREEN, INTEGRATED #2
AFP MARKER: 40 ng/mL
AFP MoM: 1.88
Crown Rump Length: 63 mm
DIA MoM: 0.72
DIA Value: 93.9 pg/mL
ESTRIOL UNCONJUGATED: 0.62 ng/mL
GEST. AGE ON COLLECTION DATE: 12.6 wk
GESTATIONAL AGE: 16.6 wk
HCG MOM: 1.49
HCG VALUE: 30.4 [IU]/mL
MATERNAL AGE AT EDD: 25.5 a
NUCHAL TRANSLUCENCY (NT): 1.6 mm
NUCHAL TRANSLUCENCY MOM: 1.01
Number of Fetuses: 1
PAPP-A MOM: 0.68
PAPP-A Value: 344.8 ng/mL
Test Results:: NEGATIVE
WEIGHT: 254 [lb_av]
Weight: 254 [lb_av]
uE3 MoM: 0.8

## 2016-08-12 ENCOUNTER — Other Ambulatory Visit: Payer: Self-pay | Admitting: Advanced Practice Midwife

## 2016-08-12 DIAGNOSIS — Z363 Encounter for antenatal screening for malformations: Secondary | ICD-10-CM

## 2016-08-13 ENCOUNTER — Ambulatory Visit (INDEPENDENT_AMBULATORY_CARE_PROVIDER_SITE_OTHER): Payer: BLUE CROSS/BLUE SHIELD | Admitting: Women's Health

## 2016-08-13 ENCOUNTER — Encounter: Payer: Self-pay | Admitting: Women's Health

## 2016-08-13 ENCOUNTER — Ambulatory Visit (INDEPENDENT_AMBULATORY_CARE_PROVIDER_SITE_OTHER): Payer: BLUE CROSS/BLUE SHIELD

## 2016-08-13 VITALS — BP 120/72 | HR 88 | Wt 261.0 lb

## 2016-08-13 DIAGNOSIS — O283 Abnormal ultrasonic finding on antenatal screening of mother: Secondary | ICD-10-CM

## 2016-08-13 DIAGNOSIS — O358XX1 Maternal care for other (suspected) fetal abnormality and damage, fetus 1: Secondary | ICD-10-CM | POA: Diagnosis not present

## 2016-08-13 DIAGNOSIS — O0992 Supervision of high risk pregnancy, unspecified, second trimester: Secondary | ICD-10-CM

## 2016-08-13 DIAGNOSIS — Z363 Encounter for antenatal screening for malformations: Secondary | ICD-10-CM | POA: Diagnosis not present

## 2016-08-13 DIAGNOSIS — Z3A20 20 weeks gestation of pregnancy: Secondary | ICD-10-CM | POA: Diagnosis not present

## 2016-08-13 DIAGNOSIS — Z1389 Encounter for screening for other disorder: Secondary | ICD-10-CM

## 2016-08-13 DIAGNOSIS — Z331 Pregnant state, incidental: Secondary | ICD-10-CM

## 2016-08-13 DIAGNOSIS — O24419 Gestational diabetes mellitus in pregnancy, unspecified control: Secondary | ICD-10-CM

## 2016-08-13 LAB — POCT URINALYSIS DIPSTICK
Blood, UA: NEGATIVE
GLUCOSE UA: NEGATIVE
Ketones, UA: NEGATIVE
Leukocytes, UA: NEGATIVE
Nitrite, UA: NEGATIVE
PROTEIN UA: NEGATIVE

## 2016-08-13 NOTE — Progress Notes (Signed)
US 19+4 wks,cephalic,cx 4.1 cm,normal ov's bilat,post pl gr 0,svp of fluid 6.6 cm,fhr 144 bpm,LVEICF 2.1 mm,efw 311 g,anatomy complete

## 2016-08-13 NOTE — Progress Notes (Signed)
High Risk Pregnancy Diagnosis(es): A2/B DM G1P0 10010w4d Estimated Date of Delivery: 01/03/17 BP 120/72   Pulse 88   Wt 261 lb (118.4 kg)   LMP 03/29/2016 (Exact Date)   BMI 47.74 kg/m   Urinalysis: Negative HPI:  Not consistent w/ checking sugars, states it hurts and it's hard to remember. Almost always checks fasting.  FBS 71-97 (only 2 >90), 2hr pp 96-128 (4 >120) BP, weight, and urine reviewed.  Reports good fm. Denies regular uc's, lof, vb, uti s/s. No complaints.  Fundal Height:  19wks Fetal Heart rate:  +u/s Edema:  none  Reviewed today's anatomy u/s, female w/ Lt EICF w/ neg nt/it, otherwise normal, no further f/u needed. Discussed importance of checking sugars QID and being good about diet.  All questions were answered Assessment: 5010w4d A2/B DM Medication(s) Plans:  Continue Glyburide 2.5mg  q hs, baby asa Treatment Plan:  Growth u/s @  24, 28, 32, 35, 38wks      Fetal echo 24-28wks   2x/wk testing @ 32wks      Deliver @ 39wks Follow up in 2wks for high-risk OB appt/check on sugars

## 2016-08-13 NOTE — Patient Instructions (Signed)
Second Trimester of Pregnancy The second trimester is from week 13 through week 28 (months 4 through 6). The second trimester is often a time when you feel your best. Your body has also adjusted to being pregnant, and you begin to feel better physically. Usually, morning sickness has lessened or quit completely, you may have more energy, and you may have an increase in appetite. The second trimester is also a time when the fetus is growing rapidly. At the end of the sixth month, the fetus is about 9 inches long and weighs about 1 pounds. You will likely begin to feel the baby move (quickening) between 18 and 20 weeks of the pregnancy. Body changes during your second trimester Your body continues to go through many changes during your second trimester. The changes vary from woman to woman.  Your weight will continue to increase. You will notice your lower abdomen bulging out.  You may begin to get stretch marks on your hips, abdomen, and breasts.  You may develop headaches that can be relieved by medicines. The medicines should be approved by your health care provider.  You may urinate more often because the fetus is pressing on your bladder.  You may develop or continue to have heartburn as a result of your pregnancy.  You may develop constipation because certain hormones are causing the muscles that push waste through your intestines to slow down.  You may develop hemorrhoids or swollen, bulging veins (varicose veins).  You may have back pain. This is caused by:  Weight gain.  Pregnancy hormones that are relaxing the joints in your pelvis.  A shift in weight and the muscles that support your balance.  Your breasts will continue to grow and they will continue to become tender.  Your gums may bleed and may be sensitive to brushing and flossing.  Dark spots or blotches (chloasma, mask of pregnancy) may develop on your face. This will likely fade after the baby is born.  A dark line  from your belly button to the pubic area (linea nigra) may appear. This will likely fade after the baby is born.  You may have changes in your hair. These can include thickening of your hair, rapid growth, and changes in texture. Some women also have hair loss during or after pregnancy, or hair that feels dry or thin. Your hair will most likely return to normal after your baby is born. What to expect at prenatal visits During a routine prenatal visit:  You will be weighed to make sure you and the fetus are growing normally.  Your blood pressure will be taken.  Your abdomen will be measured to track your baby's growth.  The fetal heartbeat will be listened to.  Any test results from the previous visit will be discussed. Your health care provider may ask you:  How you are feeling.  If you are feeling the baby move.  If you have had any abnormal symptoms, such as leaking fluid, bleeding, severe headaches, or abdominal cramping.  If you are using any tobacco products, including cigarettes, chewing tobacco, and electronic cigarettes.  If you have any questions. Other tests that may be performed during your second trimester include:  Blood tests that check for:  Low iron levels (anemia).  Gestational diabetes (between 24 and 28 weeks).  Rh antibodies. This is to check for a protein on red blood cells (Rh factor).  Urine tests to check for infections, diabetes, or protein in the urine.  An ultrasound to   confirm the proper growth and development of the baby.  An amniocentesis to check for possible genetic problems.  Fetal screens for spina bifida and Down syndrome.  HIV (human immunodeficiency virus) testing. Routine prenatal testing includes screening for HIV, unless you choose not to have this test. Follow these instructions at home: Eating and drinking  Continue to eat regular, healthy meals.  Avoid raw meat, uncooked cheese, cat litter boxes, and soil used by cats. These  carry germs that can cause birth defects in the baby.  Take your prenatal vitamins.  Take 1500-2000 mg of calcium daily starting at the 20th week of pregnancy until you deliver your baby.  If you develop constipation:  Take over-the-counter or prescription medicines.  Drink enough fluid to keep your urine clear or pale yellow.  Eat foods that are high in fiber, such as fresh fruits and vegetables, whole grains, and beans.  Limit foods that are high in fat and processed sugars, such as fried and sweet foods. Activity  Exercise only as directed by your health care provider. Experiencing uterine cramps is a good sign to stop exercising.  Avoid heavy lifting, wear low heel shoes, and practice good posture.  Wear your seat belt at all times when driving.  Rest with your legs elevated if you have leg cramps or low back pain.  Wear a good support bra for breast tenderness.  Do not use hot tubs, steam rooms, or saunas. Lifestyle  Avoid all smoking, herbs, alcohol, and unprescribed drugs. These chemicals affect the formation and growth of the baby.  Do not use any products that contain nicotine or tobacco, such as cigarettes and e-cigarettes. If you need help quitting, ask your health care provider.  A sexual relationship may be continued unless your health care provider directs you otherwise. General instructions  Follow your health care provider's instructions regarding medicine use. There are medicines that are either safe or unsafe to take during pregnancy.  Take warm sitz baths to soothe any pain or discomfort caused by hemorrhoids. Use hemorrhoid cream if your health care provider approves.  If you develop varicose veins, wear support hose. Elevate your feet for 15 minutes, 3-4 times a day. Limit salt in your diet.  Visit your dentist if you have not gone yet during your pregnancy. Use a soft toothbrush to brush your teeth and be gentle when you floss.  Keep all follow-up  prenatal visits as told by your health care provider. This is important. Contact a health care provider if:  You have dizziness.  You have mild pelvic cramps, pelvic pressure, or nagging pain in the abdominal area.  You have persistent nausea, vomiting, or diarrhea.  You have a bad smelling vaginal discharge.  You have pain with urination. Get help right away if:  You have a fever.  You are leaking fluid from your vagina.  You have spotting or bleeding from your vagina.  You have severe abdominal cramping or pain.  You have rapid weight gain or weight loss.  You have shortness of breath with chest pain.  You notice sudden or extreme swelling of your face, hands, ankles, feet, or legs.  You have not felt your baby move in over an hour.  You have severe headaches that do not go away with medicine.  You have vision changes. Summary  The second trimester is from week 13 through week 28 (months 4 through 6). It is also a time when the fetus is growing rapidly.  Your body goes   through many changes during pregnancy. The changes vary from woman to woman.  Avoid all smoking, herbs, alcohol, and unprescribed drugs. These chemicals affect the formation and growth your baby.  Do not use any tobacco products, such as cigarettes, chewing tobacco, and e-cigarettes. If you need help quitting, ask your health care provider.  Contact your health care provider if you have any questions. Keep all prenatal visits as told by your health care provider. This is important. This information is not intended to replace advice given to you by your health care provider. Make sure you discuss any questions you have with your health care provider. Document Released: 08/26/2001 Document Revised: 02/07/2016 Document Reviewed: 11/02/2012 Elsevier Interactive Patient Education  2017 [employer] Inc[employer]  

## 2016-08-22 ENCOUNTER — Telehealth: Payer: Self-pay | Admitting: *Deleted

## 2016-08-22 NOTE — Telephone Encounter (Signed)
Spoke with pt. Pt has been taking generic Prilosec. She has heard that it can cause liver issues. Should she continue this or take something else? Thanks!! JSY

## 2016-08-22 NOTE — Telephone Encounter (Signed)
Cassandra BumpsJessica said she is on prilosec and is was wondering if she should stop,she has heard that there is liver issues. And her father in law was taken off, I told her it should be fine, that may be long term use was the probelm

## 2016-08-27 ENCOUNTER — Ambulatory Visit (INDEPENDENT_AMBULATORY_CARE_PROVIDER_SITE_OTHER): Payer: BLUE CROSS/BLUE SHIELD | Admitting: Women's Health

## 2016-08-27 ENCOUNTER — Encounter: Payer: Self-pay | Admitting: Women's Health

## 2016-08-27 VITALS — BP 124/68 | HR 88 | Wt 259.0 lb

## 2016-08-27 DIAGNOSIS — O0992 Supervision of high risk pregnancy, unspecified, second trimester: Secondary | ICD-10-CM

## 2016-08-27 DIAGNOSIS — Z1389 Encounter for screening for other disorder: Secondary | ICD-10-CM

## 2016-08-27 DIAGNOSIS — O24419 Gestational diabetes mellitus in pregnancy, unspecified control: Secondary | ICD-10-CM

## 2016-08-27 DIAGNOSIS — Z3A22 22 weeks gestation of pregnancy: Secondary | ICD-10-CM | POA: Diagnosis not present

## 2016-08-27 DIAGNOSIS — Z331 Pregnant state, incidental: Secondary | ICD-10-CM

## 2016-08-27 LAB — POCT URINALYSIS DIPSTICK
Blood, UA: NEGATIVE
GLUCOSE UA: NEGATIVE
LEUKOCYTES UA: NEGATIVE
NITRITE UA: NEGATIVE
Protein, UA: NEGATIVE

## 2016-08-27 MED ORDER — GLYBURIDE 2.5 MG PO TABS: 2.5000 mg | ORAL_TABLET | Freq: Two times a day (BID) | ORAL | 3 refills | Status: DC

## 2016-08-27 NOTE — Progress Notes (Signed)
High Risk Pregnancy Diagnosis(es): A2/B DM G1P0 3945w4d Estimated Date of Delivery: 01/03/17 BP 124/68   Pulse 88   Wt 259 lb (117.5 kg)   LMP 03/29/2016 (Exact Date)   BMI 47.37 kg/m   Urinalysis: Negative HPI:  All FBS <90, about 1/2 of 2hr pp >120 (highest 150s). Is doing better w/ diet. Getting up later d/t change in husbands work schedule, so eating brunch and then supper, so only checking two 2hr pp. Wal[place] almost [place] [employer]. Is down 2lb from last visit!  BP, weight, and urine reviewed.  Reports good fm. Denies regular uc's, lof, vb, uti s/s. No complaints.  Fundal Height:  33 Fetal Heart rate:  153 Edema:  none  Reviewed warning s/s to report All questions were answered Assessment: 5245w4d A2/B DM Medication(s) Plans:  Add glyburide 2.5mg  in AM, continue 2.5mg  qhs and baby asa Treatment Plan:  Growth u/s @  24, 28, 32, 35, 38wks      Fetal echo 24-28wks  2x/wk testing @ 32wks      Deliver @ 39wks Follow up in 2.5wks for high-risk OB appt and efw u/s

## 2016-08-27 NOTE — Patient Instructions (Addendum)
Add 2.5mg  glyburide in the morning (continue 2.5mg  at night)  Second Trimester of Pregnancy The second trimester is from week 13 through week 28 (months 4 through 6). The second trimester is often a time when you feel your best. Your body has also adjusted to being pregnant, and you begin to feel better physically. Usually, morning sickness has lessened or quit completely, you may have more energy, and you may have an increase in appetite. The second trimester is also a time when the fetus is growing rapidly. At the end of the sixth month, the fetus is about 9 inches long and weighs about 1 pounds. You will likely begin to feel the baby move (quickening) between 18 and 20 weeks of the pregnancy. Body changes during your second trimester Your body continues to go through many changes during your second trimester. The changes vary from woman to woman.  Your weight will continue to increase. You will notice your lower abdomen bulging out.  You may begin to get stretch marks on your hips, abdomen, and breasts.  You may develop headaches that can be relieved by medicines. The medicines should be approved by your health care provider.  You may urinate more often because the fetus is pressing on your bladder.  You may develop or continue to have heartburn as a result of your pregnancy.  You may develop constipation because certain hormones are causing the muscles that push waste through your intestines to slow down.  You may develop hemorrhoids or swollen, bulging veins (varicose veins).  You may have back pain. This is caused by:  Weight gain.  Pregnancy hormones that are relaxing the joints in your pelvis.  A shift in weight and the muscles that support your balance.  Your breasts will continue to grow and they will continue to become tender.  Your gums may bleed and may be sensitive to brushing and flossing.  Dark spots or blotches (chloasma, mask of pregnancy) may develop on your face.  This will likely fade after the baby is born.  A dark line from your belly button to the pubic area (linea nigra) may appear. This will likely fade after the baby is born.  You may have changes in your hair. These can include thickening of your hair, rapid growth, and changes in texture. Some women also have hair loss during or after pregnancy, or hair that feels dry or thin. Your hair will most likely return to normal after your baby is born. What to expect at prenatal visits During a routine prenatal visit:  You will be weighed to make sure you and the fetus are growing normally.  Your blood pressure will be taken.  Your abdomen will be measured to track your baby's growth.  The fetal heartbeat will be listened to.  Any test results from the previous visit will be discussed. Your health care provider may ask you:  How you are feeling.  If you are feeling the baby move.  If you have had any abnormal symptoms, such as leaking fluid, bleeding, severe headaches, or abdominal cramping.  If you are using any tobacco products, including cigarettes, chewing tobacco, and electronic cigarettes.  If you have any questions. Other tests that may be performed during your second trimester include:  Blood tests that check for:  Low iron levels (anemia).  Gestational diabetes (between 24 and 28 weeks).  Rh antibodies. This is to check for a protein on red blood cells (Rh factor).  Urine tests to check for  infections, diabetes, or protein in the urine.  An ultrasound to confirm the proper growth and development of the baby.  An amniocentesis to check for possible genetic problems.  Fetal screens for spina bifida and Down syndrome.  HIV (human immunodeficiency virus) testing. Routine prenatal testing includes screening for HIV, unless you choose not to have this test. Follow these instructions at home: Eating and drinking  Continue to eat regular, healthy meals.  Avoid raw meat,  uncooked cheese, cat litter boxes, and soil used by cats. These carry germs that can cause birth defects in the baby.  Take your prenatal vitamins.  Take 1500-2000 mg of calcium daily starting at the 20th week of pregnancy until you deliver your baby.  If you develop constipation:  Take over-the-counter or prescription medicines.  Drink enough fluid to keep your urine clear or pale yellow.  Eat foods that are high in fiber, such as fresh fruits and vegetables, whole grains, and beans.  Limit foods that are high in fat and processed sugars, such as fried and sweet foods. Activity  Exercise only as directed by your health care provider. Experiencing uterine cramps is a good sign to stop exercising.  Avoid heavy lifting, wear low heel shoes, and practice good posture.  Wear your seat belt at all times when driving.  Rest with your legs elevated if you have leg cramps or low back pain.  Wear a good support bra for breast tenderness.  Do not use hot tubs, steam rooms, or saunas. Lifestyle  Avoid all smoking, herbs, alcohol, and unprescribed drugs. These chemicals affect the formation and growth of the baby.  Do not use any products that contain nicotine or tobacco, such as cigarettes and e-cigarettes. If you need help quitting, ask your health care provider.  A sexual relationship may be continued unless your health care provider directs you otherwise. General instructions  Follow your health care provider's instructions regarding medicine use. There are medicines that are either safe or unsafe to take during pregnancy.  Take warm sitz baths to soothe any pain or discomfort caused by hemorrhoids. Use hemorrhoid cream if your health care provider approves.  If you develop varicose veins, wear support hose. Elevate your feet for 15 minutes, 3-4 times a day. Limit salt in your diet.  Visit your dentist if you have not gone yet during your pregnancy. Use a soft toothbrush to brush  your teeth and be gentle when you floss.  Keep all follow-up prenatal visits as told by your health care provider. This is important. Contact a health care provider if:  You have dizziness.  You have mild pelvic cramps, pelvic pressure, or nagging pain in the abdominal area.  You have persistent nausea, vomiting, or diarrhea.  You have a bad smelling vaginal discharge.  You have pain with urination. Get help right away if:  You have a fever.  You are leaking fluid from your vagina.  You have spotting or bleeding from your vagina.  You have severe abdominal cramping or pain.  You have rapid weight gain or weight loss.  You have shortness of breath with chest pain.  You notice sudden or extreme swelling of your face, hands, ankles, feet, or legs.  You have not felt your baby move in over an hour.  You have severe headaches that do not go away with medicine.  You have vision changes. Summary  The second trimester is from week 13 through week 28 (months 4 through 6). It is also a  time when the fetus is growing rapidly.  Your body goes through many changes during pregnancy. The changes vary from woman to woman.  Avoid all smoking, herbs, alcohol, and unprescribed drugs. These chemicals affect the formation and growth your baby.  Do not use any tobacco products, such as cigarettes, chewing tobacco, and e-cigarettes. If you need help quitting, ask your health care provider.  Contact your health care provider if you have any questions. Keep all prenatal visits as told by your health care provider. This is important. This information is not intended to replace advice given to you by your health care provider. Make sure you discuss any questions you have with your health care provider. Document Released: 08/26/2001 Document Revised: 02/07/2016 Document Reviewed: 11/02/2012 Elsevier Interactive Patient Education  2017 [employer]

## 2016-09-17 ENCOUNTER — Ambulatory Visit (INDEPENDENT_AMBULATORY_CARE_PROVIDER_SITE_OTHER): Payer: BLUE CROSS/BLUE SHIELD

## 2016-09-17 ENCOUNTER — Encounter: Payer: Self-pay | Admitting: Obstetrics and Gynecology

## 2016-09-17 ENCOUNTER — Ambulatory Visit (INDEPENDENT_AMBULATORY_CARE_PROVIDER_SITE_OTHER): Payer: BLUE CROSS/BLUE SHIELD | Admitting: Obstetrics and Gynecology

## 2016-09-17 VITALS — BP 122/74 | HR 80 | Wt 258.0 lb

## 2016-09-17 DIAGNOSIS — O24419 Gestational diabetes mellitus in pregnancy, unspecified control: Secondary | ICD-10-CM

## 2016-09-17 DIAGNOSIS — Z331 Pregnant state, incidental: Secondary | ICD-10-CM

## 2016-09-17 DIAGNOSIS — Z3A25 25 weeks gestation of pregnancy: Secondary | ICD-10-CM

## 2016-09-17 DIAGNOSIS — Z3402 Encounter for supervision of normal first pregnancy, second trimester: Secondary | ICD-10-CM

## 2016-09-17 DIAGNOSIS — Z1389 Encounter for screening for other disorder: Secondary | ICD-10-CM

## 2016-09-17 DIAGNOSIS — O358XX1 Maternal care for other (suspected) fetal abnormality and damage, fetus 1: Secondary | ICD-10-CM

## 2016-09-17 DIAGNOSIS — O0992 Supervision of high risk pregnancy, unspecified, second trimester: Secondary | ICD-10-CM

## 2016-09-17 LAB — POCT URINALYSIS DIPSTICK
Blood, UA: NEGATIVE
GLUCOSE UA: NEGATIVE
Ketones, UA: NEGATIVE
LEUKOCYTES UA: NEGATIVE
Nitrite, UA: NEGATIVE

## 2016-09-17 NOTE — Progress Notes (Signed)
Patient ID: Cassandra [name], female   DOB: 11/09/1990, 26 y.o.   MRN: [id number]  High Risk Pregnancy Diagnosis(es):   A2/B DM  G1P0 6534w4d Estimated Date of Delivery: 01/03/17  Blood pressure 122/74, pulse 80, weight 258 lb (117 kg), last menstrual period 03/29/2016.  Urinalysis: trace of protein, otherwise negative.  HPI: The patient is being seen today for ongoing management of A2/B DM. Today she reports no complaints with her pregnancy. Pt notes that she has had issues with her A2/B DM during this pregnancy. Pt reports that she is walking more regularly around her neighborhood. Pt denies any other symptoms at this time.   BP weight and urine results all reviewed and noted. Patient reports good fetal movement, denies any bleeding and no rupture of membranes symptoms or regular contractions.  Fundal Height:  35 cm maternal obesity felt present and pannus present Fetal Heart rate: 151 per US  Patient is without complaints other than noted in her HPI. All questions were answered.  All lab and sonogram results have been reviewed. Comments: normal  Assessment:  1.  Pregnancy at 7134w4d,  Estimated Date of Delivery: 01/03/17                        2.  A2/B DM  Treatment Plan: follow up in 2 weeks for routine OB  No Follow-up on file. for appointment for high risk OB care  By signing my name below, I, Soijett Blue, attest that this documentation has been prepared under the direction and in the presence of Tilda BurrowJohn V Shante Archambeault, MD. Electronically Signed: Soijett Blue, ED Scribe. 09/17/16. 11:58 AM.  I personally performed the services described in this documentation, which was SCRIBED in my presence. The recorded information has been reviewed and considered accurate. It has been edited as necessary during review. Tilda BurrowFERGUSON,Ty Buntrock V, MD

## 2016-09-17 NOTE — Progress Notes (Signed)
US 24+4 wks,cx 4.6 cm,cephalic,post pl gr 0,svp of fluid 7 cm,normal ov's bilat,fhr 151 bpm,LVEICF N/C 2 mm,efw 710 g 46%

## 2016-10-01 ENCOUNTER — Encounter: Payer: BLUE CROSS/BLUE SHIELD | Admitting: Obstetrics and Gynecology

## 2016-10-06 ENCOUNTER — Ambulatory Visit (INDEPENDENT_AMBULATORY_CARE_PROVIDER_SITE_OTHER): Payer: BLUE CROSS/BLUE SHIELD | Admitting: Obstetrics and Gynecology

## 2016-10-06 ENCOUNTER — Encounter: Payer: Self-pay | Admitting: Obstetrics and Gynecology

## 2016-10-06 VITALS — BP 112/70 | HR 91 | Wt 262.4 lb

## 2016-10-06 DIAGNOSIS — Z1389 Encounter for screening for other disorder: Secondary | ICD-10-CM

## 2016-10-06 DIAGNOSIS — O24419 Gestational diabetes mellitus in pregnancy, unspecified control: Secondary | ICD-10-CM

## 2016-10-06 DIAGNOSIS — Z331 Pregnant state, incidental: Secondary | ICD-10-CM

## 2016-10-06 DIAGNOSIS — O0992 Supervision of high risk pregnancy, unspecified, second trimester: Secondary | ICD-10-CM

## 2016-10-06 DIAGNOSIS — O0990 Supervision of high risk pregnancy, unspecified, unspecified trimester: Secondary | ICD-10-CM

## 2016-10-06 DIAGNOSIS — Z3A27 27 weeks gestation of pregnancy: Secondary | ICD-10-CM

## 2016-10-06 NOTE — Progress Notes (Signed)
Patient ID: Cassandra [name], female   DOB: 01/14/1991, 26 y.o.   MRN: [id number]   High Risk Pregnancy HROB Diagnosis(es):   A2/B DM  G1P0 6290w2d Estimated Date of Delivery: 01/03/17     HPI: The patient is being seen today for ongoing management of the above. Today she reports she is doing well without complaints. She states she had fasting CBGs of 159, 130 on a different finger, and 95 on the other hand, all within minutes of one another. She reports she otherwise did not keep an extensive record this month.   Patient reports good fetal movement. She denies any bleeding and no rupture of membranes symptoms or regular contractions.  BP weight and urine results reviewed and noted. Blood pressure 112/70, pulse 91, weight 262 lb 6.4 oz (119 kg), last menstrual period 03/29/2016.  Fundal Height:  36 cm, U + 15 cm  Fetal Heart rate:  141 bpm Physical Examination: Abdomen - soft, nontender, nondistended, no masses or organomegaly                                     Edema:  none  Urinalysis:NEGATIVE for all  Fetal Surveillance Testing today:  none  Lab and sonogram results have been reviewed.  Assessment:  1.  Pregnancy at 9390w2d,  G1P0   :  Estimated Date of Delivery: 01/03/17                         2.  A2/B DM, poor compliance with BS checks                           Medication(s) Plans:  glyburide 2.5mg  in AM, 2.5mg  qhs and baby asa  Treatment Plan:   1. Growth u/s @ 28, 32, 35, 38wks       2. Fetal echo 24-28wks   3. 2x/wk testing @ 32wks       4. Deliver @ 39wks  Follow up in 1 week for appointment for high risk OB care, US, reassessment of CBG log (pt commits to to keep more thorough records x 1w).    By signing my name below, I, Doreatha MartinEva Mathews, attest that this documentation has been prepared under the direction and in the presence of Tilda BurrowJohn Keturah Yerby V, MD. Electronically Signed: Doreatha MartinEva Mathews, ED Scribe. 10/06/16. 11:26 AM.  I personally performed the services described in this  documentation, which was SCRIBED in my presence. The recorded information has been reviewed and considered accurate. It has been edited as necessary during review. Tilda BurrowFERGUSON,Jeanise Durfey V, MD

## 2016-10-07 LAB — CBC
Hematocrit: 38.5 % (ref 34.0–46.6)
Hemoglobin: 12.6 g/dL (ref 11.1–15.9)
MCH: 29 pg (ref 26.6–33.0)
MCHC: 32.7 g/dL (ref 31.5–35.7)
MCV: 89 fL (ref 79–97)
PLATELETS: 220 10*3/uL (ref 150–379)
RBC: 4.35 x10E6/uL (ref 3.77–5.28)
RDW: 14.1 % (ref 12.3–15.4)
WBC: 10.7 10*3/uL (ref 3.4–10.8)

## 2016-10-07 LAB — HIV ANTIBODY (ROUTINE TESTING W REFLEX): HIV Screen 4th Generation wRfx: NONREACTIVE

## 2016-10-07 LAB — ANTIBODY SCREEN: ANTIBODY SCREEN: NEGATIVE

## 2016-10-07 LAB — RPR: RPR: NONREACTIVE

## 2016-10-09 ENCOUNTER — Other Ambulatory Visit: Payer: Self-pay | Admitting: Obstetrics and Gynecology

## 2016-10-09 DIAGNOSIS — O24410 Gestational diabetes mellitus in pregnancy, diet controlled: Secondary | ICD-10-CM

## 2016-10-13 ENCOUNTER — Encounter: Payer: Self-pay | Admitting: Women's Health

## 2016-10-13 ENCOUNTER — Ambulatory Visit (INDEPENDENT_AMBULATORY_CARE_PROVIDER_SITE_OTHER): Payer: BLUE CROSS/BLUE SHIELD | Admitting: Women's Health

## 2016-10-13 ENCOUNTER — Ambulatory Visit (INDEPENDENT_AMBULATORY_CARE_PROVIDER_SITE_OTHER): Payer: BLUE CROSS/BLUE SHIELD

## 2016-10-13 VITALS — BP 112/64 | HR 92 | Wt 265.0 lb

## 2016-10-13 DIAGNOSIS — O24419 Gestational diabetes mellitus in pregnancy, unspecified control: Secondary | ICD-10-CM

## 2016-10-13 DIAGNOSIS — O24410 Gestational diabetes mellitus in pregnancy, diet controlled: Secondary | ICD-10-CM | POA: Diagnosis not present

## 2016-10-13 DIAGNOSIS — Z3402 Encounter for supervision of normal first pregnancy, second trimester: Secondary | ICD-10-CM

## 2016-10-13 DIAGNOSIS — O99213 Obesity complicating pregnancy, third trimester: Secondary | ICD-10-CM

## 2016-10-13 DIAGNOSIS — O0990 Supervision of high risk pregnancy, unspecified, unspecified trimester: Secondary | ICD-10-CM

## 2016-10-13 DIAGNOSIS — O358XX1 Maternal care for other (suspected) fetal abnormality and damage, fetus 1: Secondary | ICD-10-CM

## 2016-10-13 DIAGNOSIS — Z3A28 28 weeks gestation of pregnancy: Secondary | ICD-10-CM

## 2016-10-13 DIAGNOSIS — O0993 Supervision of high risk pregnancy, unspecified, third trimester: Secondary | ICD-10-CM

## 2016-10-13 DIAGNOSIS — Z1389 Encounter for screening for other disorder: Secondary | ICD-10-CM

## 2016-10-13 DIAGNOSIS — Z331 Pregnant state, incidental: Secondary | ICD-10-CM

## 2016-10-13 LAB — POCT URINALYSIS DIPSTICK
Blood, UA: NEGATIVE
Glucose, UA: NEGATIVE
Ketones, UA: NEGATIVE
LEUKOCYTES UA: NEGATIVE
NITRITE UA: NEGATIVE
PROTEIN UA: NEGATIVE

## 2016-10-13 NOTE — Patient Instructions (Signed)
Increase glyburide to 5mg  in the morning and at night  Call the office [phone]) or [facility] if:  You begin to have strong, frequent contractions  Your water breaks.  Sometimes it is a big gush of fluid, sometimes it is just a trickle that keeps getting your panties wet or running down your legs  You have vaginal bleeding.  It is normal to have a small amount of spotting if your cervix was checked.   You don't feel your baby moving like normal.  If you don't, get you something to eat and drink and lay down and focus on feeling your baby move.  You should feel at least 10 movements in 2 hours.  If you don't, you should call the office or go to [facility].    Tdap Vaccine  It is recommended that you get the Tdap vaccine during the third trimester of EACH pregnancy to help protect your baby from getting pertussis (whooping cough)  27-36 weeks is the BEST time to do this so that you can pass the protection on to your baby. During pregnancy is better than after pregnancy, but if you are unable to get it during pregnancy it will be offered at the hospital.   You can get this vaccine at the health department or your family doctor  Everyone who will be around your baby should also be up-to-date on their vaccines. Adults (who are not pregnant) only need 1 dose of Tdap during adulthood.   Third Trimester of Pregnancy The third trimester is from week 29 through week 42, months 7 through 9. The third trimester is a time when the fetus is growing rapidly. At the end of the ninth month, the fetus is about 20 inches in length and weighs 6-10 pounds.  BODY CHANGES Your body goes through many changes during pregnancy. The changes vary from woman to woman.   Your weight will continue to increase. You can expect to gain 25-35 pounds (11-16 kg) by the end of the pregnancy.  You may begin to get stretch marks on your hips, abdomen, and breasts.  You may urinate more often because the  fetus is moving lower into your pelvis and pressing on your bladder.  You may develop or continue to have heartburn as a result of your pregnancy.  You may develop constipation because certain hormones are causing the muscles that push waste through your intestines to slow down.  You may develop hemorrhoids or swollen, bulging veins (varicose veins).  You may have pelvic pain because of the weight gain and pregnancy hormones relaxing your joints between the bones in your pelvis. Backaches may result from overexertion of the muscles supporting your posture.  You may have changes in your hair. These can include thickening of your hair, rapid growth, and changes in texture. Some women also have hair loss during or after pregnancy, or hair that feels dry or thin. Your hair will most likely return to normal after your baby is born.  Your breasts will continue to grow and be tender. A yellow discharge may leak from your breasts called colostrum.  Your belly button may stick out.  You may feel short of breath because of your expanding uterus.  You may notice the fetus "dropping," or moving lower in your abdomen.  You may have a bloody mucus discharge. This usually occurs a few days to a week before labor begins.  Your cervix becomes thin and soft (effaced) near your due date. WHAT  TO EXPECT AT YOUR PRENATAL EXAMS  You will have prenatal exams every 2 weeks until week 36. Then, you will have weekly prenatal exams. During a routine prenatal visit:  You will be weighed to make sure you and the fetus are growing normally.  Your blood pressure is taken.  Your abdomen will be measured to track your baby's growth.  The fetal heartbeat will be listened to.  Any test results from the previous visit will be discussed.  You may have a cervical check near your due date to see if you have effaced. At around 36 weeks, your caregiver will check your cervix. At the same time, your caregiver will also  perform a test on the secretions of the vaginal tissue. This test is to determine if a type of bacteria, Group B streptococcus, is present. Your caregiver will explain this further. Your caregiver may ask you:  What your birth plan is.  How you are feeling.  If you are feeling the baby move.  If you have had any abnormal symptoms, such as leaking fluid, bleeding, severe headaches, or abdominal cramping.  If you have any questions. Other tests or screenings that may be performed during your third trimester include:  Blood tests that check for low iron levels (anemia).  Fetal testing to check the health, activity level, and growth of the fetus. Testing is done if you have certain medical conditions or if there are problems during the pregnancy. FALSE LABOR You may feel small, irregular contractions that eventually go away. These are called Braxton Hicks contractions, or false labor. Contractions may last for hours, days, or even weeks before true labor sets in. If contractions come at regular intervals, intensify, or become painful, it is best to be seen by your caregiver.  SIGNS OF LABOR   Menstrual-like cramps.  Contractions that are 5 minutes apart or less.  Contractions that start on the top of the uterus and spread down to the lower abdomen and back.  A sense of increased pelvic pressure or back pain.  A watery or bloody mucus discharge that comes from the vagina. If you have any of these signs before the 37th week of pregnancy, call your caregiver right away. You need to go to the hospital to get checked immediately. HOME CARE INSTRUCTIONS   Avoid all smoking, herbs, alcohol, and unprescribed drugs. These chemicals affect the formation and growth of the baby.  Follow your caregiver's instructions regarding medicine use. There are medicines that are either safe or unsafe to take during pregnancy.  Exercise only as directed by your caregiver. Experiencing uterine cramps is a  good sign to stop exercising.  Continue to eat regular, healthy meals.  Wear a good support bra for breast tenderness.  Do not use hot tubs, steam rooms, or saunas.  Wear your seat belt at all times when driving.  Avoid raw meat, uncooked cheese, cat litter boxes, and soil used by cats. These carry germs that can cause birth defects in the baby.  Take your prenatal vitamins.  Try taking a stool softener (if your caregiver approves) if you develop constipation. Eat more high-fiber foods, such as fresh vegetables or fruit and whole grains. Drink plenty of fluids to keep your urine clear or pale yellow.  Take warm sitz baths to soothe any pain or discomfort caused by hemorrhoids. Use hemorrhoid cream if your caregiver approves.  If you develop varicose veins, wear support hose. Elevate your feet for 15 minutes, 3-4 times a day. Limit  salt in your diet.  Avoid heavy lifting, wear low heal shoes, and practice good posture.  Rest a lot with your legs elevated if you have leg cramps or low back pain.  Visit your dentist if you have not gone during your pregnancy. Use a soft toothbrush to brush your teeth and be gentle when you floss.  A sexual relationship may be continued unless your caregiver directs you otherwise.  Do not travel far distances unless it is absolutely necessary and only with the approval of your caregiver.  Take prenatal classes to understand, practice, and ask questions about the labor and delivery.  Make a trial run to the hospital.  Pack your hospital bag.  Prepare the baby's nursery.  Continue to go to all your prenatal visits as directed by your caregiver. SEEK MEDICAL CARE IF:  You are unsure if you are in labor or if your water has broken.  You have dizziness.  You have mild pelvic cramps, pelvic pressure, or nagging pain in your abdominal area.  You have persistent nausea, vomiting, or diarrhea.  You have a bad smelling vaginal discharge.  You  have pain with urination. SEEK IMMEDIATE MEDICAL CARE IF:   You have a fever.  You are leaking fluid from your vagina.  You have spotting or bleeding from your vagina.  You have severe abdominal cramping or pain.  You have rapid weight loss or gain.  You have shortness of breath with chest pain.  You notice sudden or extreme swelling of your face, hands, ankles, feet, or legs.  You have not felt your baby move in over an hour.  You have severe headaches that do not go away with medicine.  You have vision changes. Document Released: 08/26/2001 Document Revised: 09/06/2013 Document Reviewed: 11/02/2012 [facility] Patient Information 2015 [place], [place]. This information is not intended to replace advice given to you by your health care provider. Make sure you discuss any questions you have with your health care provider.

## 2016-10-13 NOTE — Progress Notes (Signed)
High Risk Pregnancy Diagnosis(es): A2/B DM G1P0 768w2d Estimated Date of Delivery: 01/03/17 BP 112/64   Pulse 92   Wt 265 lb (120.2 kg)   LMP 03/29/2016 (Exact Date)   BMI 48.47 kg/m   Urinalysis: Negative HPI:  fbs 83-103 (5 >90), 2hr pp 70-143 (5 >120), occ low of 50s- maybe once/wk, is only checking sugars TID d/t work schedule and not eating breakfast. Has gained 3lbs in 1wk, is eating waffles, hamburgers/sandwiches w/ buns- which she says shoots sugars up worse than waffles. Eggo waffles 'keep it normal'. States she thinks her glucometer is 'off' can get multiple different readings from different fingers. To bring glucometer next time and will check against ours.  Has not had fetal echo BP, weight, and urine reviewed.  Reports good fm. Denies regular uc's, lof, vb, uti s/s. No complaints.  Fundal Height:  Not done Fetal Heart rate:  165 u/s Edema:  none  Reviewed today's f/u u/s: efw 61%, afi high normal 22.9cm, Lt EICF not seen today but limited view of heart.  All questions were answered Assessment: 2368w2d A2/B DM Medication(s) Plans:  Increase glyburide to 5mg  BID, continue baby asa daily Treatment Plan:  Growth u/s @ 32, 35, 38wks     2x/wk testing @ 32wks      Deliver @ 39wks Referral for fetal echo faxed today, pt to let us know if she doesn't hear from them w/in 1wk Follow up in 1wk for high-risk OB appt w/MD to assess sugars

## 2016-10-13 NOTE — Progress Notes (Signed)
US 28+2 wks,cephalic,cx 4.4 cm,fhr 165 bpm,post pl gr 0,bilat adnexa's wnl,afi 22.9 cm,EFW 1296 g  61%,LVEICF not seen on today's ultrasound (limited view of heart)

## 2016-10-21 ENCOUNTER — Ambulatory Visit (INDEPENDENT_AMBULATORY_CARE_PROVIDER_SITE_OTHER): Payer: Medicaid Other | Admitting: Obstetrics & Gynecology

## 2016-10-21 VITALS — BP 120/80 | HR 76 | Wt 266.0 lb

## 2016-10-21 DIAGNOSIS — Z331 Pregnant state, incidental: Secondary | ICD-10-CM

## 2016-10-21 DIAGNOSIS — Z3A30 30 weeks gestation of pregnancy: Secondary | ICD-10-CM

## 2016-10-21 DIAGNOSIS — Z1389 Encounter for screening for other disorder: Secondary | ICD-10-CM

## 2016-10-21 DIAGNOSIS — O24419 Gestational diabetes mellitus in pregnancy, unspecified control: Secondary | ICD-10-CM

## 2016-10-21 DIAGNOSIS — O0993 Supervision of high risk pregnancy, unspecified, third trimester: Secondary | ICD-10-CM

## 2016-10-21 LAB — POCT URINALYSIS DIPSTICK
Blood, UA: NEGATIVE
Glucose, UA: NEGATIVE
Leukocytes, UA: NEGATIVE
Nitrite, UA: NEGATIVE

## 2016-10-21 MED ORDER — GLYBURIDE 5 MG PO TABS: ORAL_TABLET | ORAL | 11 refills | Status: DC

## 2016-10-21 NOTE — Progress Notes (Signed)
Fetal Surveillance Testing today:  FHT 165   High Risk Pregnancy Diagnosis(es):   Class A2 DM  G1P0 5248w3d Estimated Date of Delivery: 01/03/17  Blood pressure 120/80, pulse 76, weight 266 lb (120.7 kg), last menstrual period 03/29/2016.  Urinalysis: Negative   HPI: The patient is being seen today for ongoing management of Class A2 DM. Today she reports CBG are little high    BP weight and urine results all reviewed and noted. Patient reports good fetal movement, denies any bleeding and no rupture of membranes symptoms or regular contractions.  Fundal Height:  U+22 Fetal Heart rate:  165 Edema:  none  Patient is without complaints other than noted in her HPI. All questions were answered.  All lab and sonogram results have been reviewed. Comments:    Assessment:  1.  Pregnancy at 1148w3d,  Estimated Date of Delivery: 01/03/17 :                          2.  Class A2 DM                        3.    Medication(s) Plans:  Increase pm glyburide to 7.5 mg, continue am glyburide 5 mg  Treatment Plan:  Per protocol  Return in about 2 weeks (around 11/04/2016) for HROB. for appointment for high risk OB care  Meds ordered this encounter  Medications  . glyBURIDE (DIABETA) 5 MG tablet    Sig: Take 1 in am, 1.5 in pm    Dispense:  75 tablet    Refill:  11   Orders Placed This Encounter  Procedures  . POCT urinalysis dipstick

## 2016-10-27 DIAGNOSIS — O24415 Gestational diabetes mellitus in pregnancy, controlled by oral hypoglycemic drugs: Secondary | ICD-10-CM | POA: Diagnosis not present

## 2016-10-27 DIAGNOSIS — O24912 Unspecified diabetes mellitus in pregnancy, second trimester: Secondary | ICD-10-CM | POA: Diagnosis not present

## 2016-11-04 ENCOUNTER — Ambulatory Visit (INDEPENDENT_AMBULATORY_CARE_PROVIDER_SITE_OTHER): Payer: BLUE CROSS/BLUE SHIELD | Admitting: Obstetrics & Gynecology

## 2016-11-04 ENCOUNTER — Encounter: Payer: Self-pay | Admitting: Obstetrics & Gynecology

## 2016-11-04 VITALS — BP 124/72 | HR 78 | Wt 268.8 lb

## 2016-11-04 DIAGNOSIS — O24319 Unspecified pre-existing diabetes mellitus in pregnancy, unspecified trimester: Secondary | ICD-10-CM

## 2016-11-04 DIAGNOSIS — Z3A31 31 weeks gestation of pregnancy: Secondary | ICD-10-CM

## 2016-11-04 DIAGNOSIS — Z331 Pregnant state, incidental: Secondary | ICD-10-CM

## 2016-11-04 DIAGNOSIS — O0993 Supervision of high risk pregnancy, unspecified, third trimester: Secondary | ICD-10-CM

## 2016-11-04 DIAGNOSIS — Z1389 Encounter for screening for other disorder: Secondary | ICD-10-CM

## 2016-11-04 DIAGNOSIS — O24313 Unspecified pre-existing diabetes mellitus in pregnancy, third trimester: Secondary | ICD-10-CM

## 2016-11-04 LAB — POCT URINALYSIS DIPSTICK
Glucose, UA: NEGATIVE
KETONES UA: NEGATIVE
Leukocytes, UA: NEGATIVE
Nitrite, UA: NEGATIVE
PROTEIN UA: NEGATIVE
RBC UA: NEGATIVE

## 2016-11-04 MED ORDER — INSULIN GLARGINE 100 UNITS/ML SOLOSTAR PEN: 20.0000 [IU] | PEN_INJECTOR | Freq: Every day | SUBCUTANEOUS | 11 refills | Status: DC

## 2016-11-04 NOTE — Progress Notes (Signed)
Fetal Surveillance Testing today:  FHR 155   High Risk Pregnancy Diagnosis(es):   Class B DM  G1P0 1868w3d Estimated Date of Delivery: 01/03/17  Blood pressure 124/72, pulse 78, weight 268 lb 12.8 oz (121.9 kg), last menstrual period 03/29/2016.  Urinalysis: Negative   HPI: The patient is being seen today for ongoing management of Class B DM. Today she reports CBG are still suboptimal    BP weight and urine results all reviewed and noted. Patient reports good fetal movement, denies any bleeding and no rupture of membranes symptoms or regular contractions.  Fundal Height:  38 Fetal Heart rate:  155 Edema:  none  Patient is without complaints other than noted in her HPI. All questions were answered.  All lab and sonogram results have been reviewed. Comments:    Assessment:  1.  Pregnancy at 2768w3d,  Estimated Date of Delivery: 01/03/17 :                          2.  Class B DM, sub optimally controlled                        3.    Medication(s) Plans:  Add lantus 20 units 2200 to glyburide 5am, 7.5 pm  Treatment Plan:  Begin twice weekly testing with NST, EFW 32/36 weeks  Return in about 1 week (around 11/11/2016) for BPP/sono, HROB, with Dr Despina HiddenEure. for appointment for high risk OB care  Meds ordered this encounter  Medications  . insulin glargine (LANTUS) 100 unit/mL SOPN    Sig: Inject 0.2 mLs (20 Units total) into the skin at bedtime.    Dispense:  15 mL    Refill:  11   Orders Placed This Encounter  Procedures  . US OB Follow Up  . US Fetal BPP W/O Non Stress  . POCT urinalysis dipstick

## 2016-11-11 ENCOUNTER — Encounter: Payer: Self-pay | Admitting: Obstetrics & Gynecology

## 2016-11-11 ENCOUNTER — Ambulatory Visit (INDEPENDENT_AMBULATORY_CARE_PROVIDER_SITE_OTHER): Payer: BLUE CROSS/BLUE SHIELD

## 2016-11-11 ENCOUNTER — Ambulatory Visit (INDEPENDENT_AMBULATORY_CARE_PROVIDER_SITE_OTHER): Payer: BLUE CROSS/BLUE SHIELD | Admitting: Obstetrics & Gynecology

## 2016-11-11 VITALS — BP 100/60 | HR 84 | Wt 269.5 lb

## 2016-11-11 DIAGNOSIS — Z331 Pregnant state, incidental: Secondary | ICD-10-CM

## 2016-11-11 DIAGNOSIS — O24419 Gestational diabetes mellitus in pregnancy, unspecified control: Secondary | ICD-10-CM

## 2016-11-11 DIAGNOSIS — O24313 Unspecified pre-existing diabetes mellitus in pregnancy, third trimester: Secondary | ICD-10-CM | POA: Diagnosis not present

## 2016-11-11 DIAGNOSIS — O0990 Supervision of high risk pregnancy, unspecified, unspecified trimester: Secondary | ICD-10-CM

## 2016-11-11 DIAGNOSIS — O0992 Supervision of high risk pregnancy, unspecified, second trimester: Secondary | ICD-10-CM

## 2016-11-11 DIAGNOSIS — O0993 Supervision of high risk pregnancy, unspecified, third trimester: Secondary | ICD-10-CM

## 2016-11-11 DIAGNOSIS — O3663X1 Maternal care for excessive fetal growth, third trimester, fetus 1: Secondary | ICD-10-CM

## 2016-11-11 DIAGNOSIS — Z1389 Encounter for screening for other disorder: Secondary | ICD-10-CM

## 2016-11-11 DIAGNOSIS — Z3A33 33 weeks gestation of pregnancy: Secondary | ICD-10-CM

## 2016-11-11 DIAGNOSIS — O24319 Unspecified pre-existing diabetes mellitus in pregnancy, unspecified trimester: Secondary | ICD-10-CM

## 2016-11-11 DIAGNOSIS — Z3A32 32 weeks gestation of pregnancy: Secondary | ICD-10-CM

## 2016-11-11 LAB — POCT URINALYSIS DIPSTICK
Blood, UA: NEGATIVE
Ketones, UA: NEGATIVE
LEUKOCYTES UA: NEGATIVE
NITRITE UA: NEGATIVE
PROTEIN UA: NEGATIVE

## 2016-11-11 NOTE — Progress Notes (Signed)
US 32+3 wks,cephalic,cx 3.1 cm,fhr 160 bpm,afi 21.8 cm,post pl gr 1,EFW 2415 g 75%,AC >98%,BPD & HC >98%,BPP 8/8

## 2016-11-11 NOTE — Progress Notes (Signed)
Fetal Surveillance Testing today:  BPP 8/8   High Risk Pregnancy Diagnosis(es):   Class B DM, suspected fetal macrosomia  G1P0 1744w3d Estimated Date of Delivery: 01/03/17  Blood pressure 100/60, pulse 84, weight 269 lb 8 oz (122.2 kg), last menstrual period 03/29/2016.  Urinalysis: Negative   HPI: The patient is being seen today for ongoing management of as above. Today she reports CBG are actually good today on the the added lantus   BP weight and urine results all reviewed and noted. Patient reports good fetal movement, denies any bleeding and no rupture of membranes symptoms or regular contractions.  Fundal Height:  U+24 Fetal Heart rate:  160 Edema:  1+  Patient is without complaints other than noted in her HPI. All questions were answered.  All lab and sonogram results have been reviewed. Comments:    Assessment:  1.  Pregnancy at 3844w3d,  Estimated Date of Delivery: 01/03/17 :                          2.  Class B DM                        3.  Suspected fetal macrosomia  Medication(s) Plans:  Glyburide 5 am, 7.5 pm, lantus 20 pm  Treatment Plan:  EFW only 75% but closer observation shows the head and bdomen to be >98%, mom and dad are both short so discounting the effect of the femur length this is a suspected macrosomic infant, pt aware and agrees, ost likely scheduled C section at 39 weeks  No Follow-up on file. for appointment for high risk OB care  No orders of the defined types were placed in this encounter.  Orders Placed This Encounter  Procedures  . POCT Urinalysis Dipstick

## 2016-11-14 ENCOUNTER — Ambulatory Visit (INDEPENDENT_AMBULATORY_CARE_PROVIDER_SITE_OTHER): Payer: BLUE CROSS/BLUE SHIELD | Admitting: Women's Health

## 2016-11-14 ENCOUNTER — Encounter: Payer: Self-pay | Admitting: Women's Health

## 2016-11-14 VITALS — BP 124/72 | HR 84 | Wt 269.0 lb

## 2016-11-14 DIAGNOSIS — O3663X1 Maternal care for excessive fetal growth, third trimester, fetus 1: Secondary | ICD-10-CM | POA: Diagnosis not present

## 2016-11-14 DIAGNOSIS — O24419 Gestational diabetes mellitus in pregnancy, unspecified control: Secondary | ICD-10-CM

## 2016-11-14 DIAGNOSIS — Z331 Pregnant state, incidental: Secondary | ICD-10-CM

## 2016-11-14 DIAGNOSIS — Z3A33 33 weeks gestation of pregnancy: Secondary | ICD-10-CM

## 2016-11-14 DIAGNOSIS — Z1389 Encounter for screening for other disorder: Secondary | ICD-10-CM

## 2016-11-14 DIAGNOSIS — O0993 Supervision of high risk pregnancy, unspecified, third trimester: Secondary | ICD-10-CM

## 2016-11-14 LAB — POCT URINALYSIS DIPSTICK
Blood, UA: NEGATIVE
Ketones, UA: NEGATIVE
LEUKOCYTES UA: NEGATIVE
NITRITE UA: NEGATIVE

## 2016-11-14 NOTE — Progress Notes (Signed)
High Risk Pregnancy Diagnosis(es): A2/BDM, suspected fetal macrosomia G1P0 4486w6d Estimated Date of Delivery: 01/03/17 BP 124/72   Pulse 84   Wt 269 lb (122 kg)   LMP 03/29/2016 (Exact Date)   BMI 49.20 kg/m   Urinalysis: Positive for 2+glucose HPI:  All sugars in range BP, weight, and urine reviewed.  Reports good fm. Denies regular uc's, lof, vb, uti s/s. No complaints.  Fundal Height:  43cm, U+25 Fetal Heart rate:  155, reactive NST Edema:  none  Reviewed ptl s/s, fkc All questions were answered Assessment: 3986w6d A2/BDM, suspected fetal macrosomia Medication(s) Plans:  Glyburide 5am/7.5pm, lantus 20pm Treatment Plan:  Growth u/s @ 35, 38wks     2x/wk testing @ 32wks      Deliver @ 39wks Follow up on Tues for high-risk OB appt and NST

## 2016-11-14 NOTE — Patient Instructions (Signed)
Call the office ([phone]) or [facility] if:  You begin to have strong, frequent contractions  Your water breaks.  Sometimes it is a big gush of fluid, sometimes it is just a trickle that keeps getting your panties wet or running down your legs  You have vaginal bleeding.  It is normal to have a small amount of spotting if your cervix was checked.   You don't feel your baby moving like normal.  If you don't, get you something to eat and drink and lay down and focus on feeling your baby move.  You should feel at least 10 movements in 2 hours.  If you don't, you should call the office or go to [facility].    Tdap Vaccine  It is recommended that you get the Tdap vaccine during the third trimester of EACH pregnancy to help protect your baby from getting pertussis (whooping cough)  27-36 weeks is the BEST time to do this so that you can pass the protection on to your baby. During pregnancy is better than after pregnancy, but if you are unable to get it during pregnancy it will be offered at the hospital.   You can get this vaccine at the health department or your family doctor  Everyone who will be around your baby should also be up-to-date on their vaccines. Adults (who are not pregnant) only need 1 dose of Tdap during adulthood.    Preterm Labor and Birth Information The normal length of a pregnancy is 39-41 weeks. Preterm labor is when labor starts before 37 completed weeks of pregnancy. What are the risk factors for preterm labor? Preterm labor is more likely to occur in women who:  Have certain infections during pregnancy such as a bladder infection, sexually transmitted infection, or infection inside the uterus (chorioamnionitis).  Have a shorter-than-normal cervix.  Have gone into preterm labor before.  Have had surgery on their cervix.  Are younger than age 17 or older than age 35.  Are African American.  Are pregnant with twins or multiple babies (multiple  gestation).  Take street drugs or smoke while pregnant.  Do not gain enough weight while pregnant.  Became pregnant shortly after having been pregnant. What are the symptoms of preterm labor? Symptoms of preterm labor include:  Cramps similar to those that can happen during a menstrual period. The cramps may happen with diarrhea.  Pain in the abdomen or lower back.  Regular uterine contractions that may feel like tightening of the abdomen.  A feeling of increased pressure in the pelvis.  Increased watery or bloody mucus discharge from the vagina.  Water breaking (ruptured amniotic sac). Why is it important to recognize signs of preterm labor? It is important to recognize signs of preterm labor because babies who are born prematurely may not be fully developed. This can put them at an increased risk for:  Long-term (chronic) heart and lung problems.  Difficulty immediately after birth with regulating body systems, including blood sugar, body temperature, heart rate, and breathing rate.  Bleeding in the brain.  Cerebral palsy.  Learning difficulties.  Death. These risks are highest for babies who are born before 34 weeks of pregnancy. How is preterm labor treated? Treatment depends on the length of your pregnancy, your condition, and the health of your baby. It may involve:  Having a stitch (suture) placed in your cervix to prevent your cervix from opening too early (cerclage).  Taking or being given medicines, such as:  Hormone   medicines. These may be given early in pregnancy to help support the pregnancy.  Medicine to stop contractions.  Medicines to help mature the baby's lungs. These may be prescribed if the risk of delivery is high.  Medicines to prevent your baby from developing cerebral palsy. If the labor happens before 34 weeks of pregnancy, you may need to stay in the hospital. What should I do if I think I am in preterm labor? If you think that you are  going into preterm labor, call your health care provider right away. How can I prevent preterm labor in future pregnancies? To increase your chance of having a full-term pregnancy:  Do not use any tobacco products, such as cigarettes, chewing tobacco, and e-cigarettes. If you need help quitting, ask your health care provider.  Do not use street drugs or medicines that have not been prescribed to you during your pregnancy.  Talk with your health care provider before taking any herbal supplements, even if you have been taking them regularly.  Make sure you gain a healthy amount of weight during your pregnancy.  Watch for infection. If you think that you might have an infection, get it checked right away.  Make sure to tell your health care provider if you have gone into preterm labor before. This information is not intended to replace advice given to you by your health care provider. Make sure you discuss any questions you have with your health care provider. Document Released: 11/22/2003 Document Revised: 02/12/2016 Document Reviewed: 01/23/2016 Elsevier Interactive Patient Education  2017 [employer]     

## 2016-11-17 ENCOUNTER — Other Ambulatory Visit: Payer: BLUE CROSS/BLUE SHIELD | Admitting: Obstetrics & Gynecology

## 2016-11-20 ENCOUNTER — Ambulatory Visit (INDEPENDENT_AMBULATORY_CARE_PROVIDER_SITE_OTHER): Payer: BLUE CROSS/BLUE SHIELD | Admitting: Obstetrics & Gynecology

## 2016-11-20 ENCOUNTER — Encounter: Payer: Self-pay | Admitting: Obstetrics & Gynecology

## 2016-11-20 VITALS — BP 112/70 | HR 79 | Wt 269.0 lb

## 2016-11-20 DIAGNOSIS — O0993 Supervision of high risk pregnancy, unspecified, third trimester: Secondary | ICD-10-CM

## 2016-11-20 DIAGNOSIS — O3660X0 Maternal care for excessive fetal growth, unspecified trimester, not applicable or unspecified: Secondary | ICD-10-CM

## 2016-11-20 DIAGNOSIS — Z1389 Encounter for screening for other disorder: Secondary | ICD-10-CM

## 2016-11-20 DIAGNOSIS — O24419 Gestational diabetes mellitus in pregnancy, unspecified control: Secondary | ICD-10-CM

## 2016-11-20 DIAGNOSIS — Z331 Pregnant state, incidental: Secondary | ICD-10-CM

## 2016-11-20 LAB — POCT URINALYSIS DIPSTICK
Blood, UA: NEGATIVE
Glucose, UA: NEGATIVE
KETONES UA: NEGATIVE
Leukocytes, UA: NEGATIVE
Nitrite, UA: NEGATIVE
PROTEIN UA: NEGATIVE

## 2016-11-20 NOTE — Progress Notes (Signed)
Fetal Surveillance Testing today:  Reactive NST   High Risk Pregnancy Diagnosis(es):   Class A2/B DM, suspected LGA  G1P0 4130w5d Estimated Date of Delivery: 01/03/17  Blood pressure 112/70, pulse 79, weight 269 lb (122 kg), last menstrual period 03/29/2016.  Urinalysis: Negative   HPI: The patient is being seen today for ongoing management of as above. Today she reports CBG are now stable and acceptable   BP weight and urine results all reviewed and noted. Patient reports good fetal movement, denies any bleeding and no rupture of membranes symptoms or regular contractions.  Fundal Height:  U+20 Fetal Heart rate:  135 Edema:  1+  Patient is without complaints other than noted in her HPI. All questions were answered.  All lab and sonogram results have been reviewed. Comments:    Assessment:  1.  Pregnancy at 8030w5d,  Estimated Date of Delivery: 01/03/17 :                          2.  Class A2/B DM                        3.  Suspected LGA  Medication(s) Plans:  Glyburide 5 am, 7.5 pm, lantus 20 pm  Treatment Plan:  Twice weekly NST, sonogram 36-37 weeks for EFW  Return in about 4 days (around 11/24/2016) for NST, HROB. for appointment for high risk OB care  No orders of the defined types were placed in this encounter.  Orders Placed This Encounter  Procedures  . POCT urinalysis dipstick

## 2016-11-24 ENCOUNTER — Other Ambulatory Visit: Payer: BLUE CROSS/BLUE SHIELD | Admitting: Obstetrics and Gynecology

## 2016-11-25 ENCOUNTER — Other Ambulatory Visit: Payer: BLUE CROSS/BLUE SHIELD | Admitting: Advanced Practice Midwife

## 2016-11-26 ENCOUNTER — Ambulatory Visit (INDEPENDENT_AMBULATORY_CARE_PROVIDER_SITE_OTHER): Payer: BLUE CROSS/BLUE SHIELD | Admitting: Obstetrics & Gynecology

## 2016-11-26 ENCOUNTER — Encounter: Payer: Self-pay | Admitting: Obstetrics & Gynecology

## 2016-11-26 VITALS — BP 102/60 | HR 72 | Wt 268.0 lb

## 2016-11-26 DIAGNOSIS — O0993 Supervision of high risk pregnancy, unspecified, third trimester: Secondary | ICD-10-CM

## 2016-11-26 DIAGNOSIS — O3663X1 Maternal care for excessive fetal growth, third trimester, fetus 1: Secondary | ICD-10-CM | POA: Diagnosis not present

## 2016-11-26 DIAGNOSIS — Z331 Pregnant state, incidental: Secondary | ICD-10-CM

## 2016-11-26 DIAGNOSIS — Z1389 Encounter for screening for other disorder: Secondary | ICD-10-CM

## 2016-11-26 DIAGNOSIS — O24419 Gestational diabetes mellitus in pregnancy, unspecified control: Secondary | ICD-10-CM | POA: Diagnosis not present

## 2016-11-26 DIAGNOSIS — Z3A35 35 weeks gestation of pregnancy: Secondary | ICD-10-CM

## 2016-11-26 LAB — POCT URINALYSIS DIPSTICK
Blood, UA: NEGATIVE
GLUCOSE UA: NEGATIVE
Ketones, UA: NEGATIVE
Leukocytes, UA: NEGATIVE
NITRITE UA: NEGATIVE
Protein, UA: NEGATIVE

## 2016-11-26 NOTE — Progress Notes (Signed)
Fetal Surveillance Testing today:  Reactive NST   High Risk Pregnancy Diagnosis(es):   Class A2/B DM, suspected LGA  G1P0 957w4d  Estimated Date of Delivery: 01/03/17  Blood pressure 102/60, pulse 72, weight 268 lb (121.6 kg), last menstrual period 03/29/2016.  Urinalysis: Negative   HPI: The patient is being seen today for ongoing management of as above. Today she reports CBG are better on the lantus   BP weight and urine results all reviewed and noted. Patient reports good fetal movement, denies any bleeding and no rupture of membranes symptoms or regular contractions.  Fundal Height:  U+24 Fetal Heart rate:  145 Edema:  none  Patient is without complaints other than noted in her HPI. All questions were answered.  All lab and sonogram results have been reviewed. Comments:    Assessment:  1.  Pregnancy at 1865w5d,  Estimated Date of Delivery: 01/03/17 :                          2.  Class A2/B DM                        3.  Suspected fetal macrosomia  Medication(s) Plans:  Glyburide 5 qm, 7.5 pm, lantus 20 units qhs  Treatment Plan:  Twice weekly NST, sonogram efw 36-37 weeks  Return in about 5 days (around 12/01/2016) for NST, HROB. for appointment for high risk OB care  No orders of the defined types were placed in this encounter.  Orders Placed This Encounter  Procedures  . POCT urinalysis dipstick

## 2016-12-01 ENCOUNTER — Ambulatory Visit (INDEPENDENT_AMBULATORY_CARE_PROVIDER_SITE_OTHER): Payer: BLUE CROSS/BLUE SHIELD | Admitting: Obstetrics & Gynecology

## 2016-12-01 ENCOUNTER — Encounter: Payer: Self-pay | Admitting: Obstetrics & Gynecology

## 2016-12-01 VITALS — BP 112/62 | HR 78 | Wt 271.0 lb

## 2016-12-01 DIAGNOSIS — O24419 Gestational diabetes mellitus in pregnancy, unspecified control: Secondary | ICD-10-CM

## 2016-12-01 DIAGNOSIS — O0990 Supervision of high risk pregnancy, unspecified, unspecified trimester: Secondary | ICD-10-CM

## 2016-12-01 DIAGNOSIS — Z331 Pregnant state, incidental: Secondary | ICD-10-CM

## 2016-12-01 DIAGNOSIS — Z1389 Encounter for screening for other disorder: Secondary | ICD-10-CM

## 2016-12-01 LAB — POCT URINALYSIS DIPSTICK
Blood, UA: NEGATIVE
GLUCOSE UA: NEGATIVE
KETONES UA: NEGATIVE
Leukocytes, UA: NEGATIVE
Nitrite, UA: NEGATIVE

## 2016-12-01 NOTE — Progress Notes (Signed)
Fetal Surveillance Testing today:  Reactive NST   High Risk Pregnancy Diagnosis(es):   Class A2 DM, suspected LGA  G1P0 5894w2d Estimated Date of Delivery: 01/03/17  Blood pressure 112/62, pulse 78, weight 271 lb (122.9 kg), last menstrual period 03/29/2016.  Urinalysis: Negative   HPI: The patient is being seen today for ongoing management of Class A2 DM. Today she reports CBG have been good the last 4 days   BP weight and urine results all reviewed and noted. Patient reports good fetal movement, denies any bleeding and no rupture of membranes symptoms or regular contractions.  Fundal Height:  U+20 Fetal Heart rate:  140 Edema:  none  Patient is without complaints other than noted in her HPI. All questions were answered.  All lab and sonogram results have been reviewed. Comments:    Assessment:  1.  Pregnancy at 494w2d,  Estimated Date of Delivery: 01/03/17 :                          2.  Class A2 DM                        3.  LGA, suspected  Medication(s) Plans:  Glyburide 5 am, 7.5 pm, Lantus 30 units 2200 Treatment Plan:  EFW in 1 week, twice weekly NST, deliver at 39 weeks  Return in about 3 days (around 12/04/2016) for NST, HROB. for appointment for high risk OB care  No orders of the defined types were placed in this encounter.  Orders Placed This Encounter  Procedures  . POCT urinalysis dipstick

## 2016-12-04 ENCOUNTER — Encounter: Payer: Self-pay | Admitting: Obstetrics & Gynecology

## 2016-12-04 ENCOUNTER — Ambulatory Visit (INDEPENDENT_AMBULATORY_CARE_PROVIDER_SITE_OTHER): Payer: BLUE CROSS/BLUE SHIELD | Admitting: Obstetrics & Gynecology

## 2016-12-04 VITALS — BP 118/68 | HR 92 | Wt 270.0 lb

## 2016-12-04 DIAGNOSIS — Z1389 Encounter for screening for other disorder: Secondary | ICD-10-CM

## 2016-12-04 DIAGNOSIS — O24419 Gestational diabetes mellitus in pregnancy, unspecified control: Secondary | ICD-10-CM | POA: Diagnosis not present

## 2016-12-04 DIAGNOSIS — Z331 Pregnant state, incidental: Secondary | ICD-10-CM

## 2016-12-04 DIAGNOSIS — O0993 Supervision of high risk pregnancy, unspecified, third trimester: Secondary | ICD-10-CM

## 2016-12-04 DIAGNOSIS — O3663X1 Maternal care for excessive fetal growth, third trimester, fetus 1: Secondary | ICD-10-CM

## 2016-12-04 LAB — POCT URINALYSIS DIPSTICK
Blood, UA: NEGATIVE
Glucose, UA: NEGATIVE
KETONES UA: NEGATIVE
Leukocytes, UA: NEGATIVE
Nitrite, UA: NEGATIVE
Protein, UA: NEGATIVE

## 2016-12-04 NOTE — Progress Notes (Signed)
Fetal Surveillance Testing today:  Reactive NST   High Risk Pregnancy Diagnosis(es):   Class A2 DM, suspected LGA  G1P0 137w5d  Estimated Date of Delivery: 01/03/17  Blood pressure 118/68, pulse 92, weight 270 lb (122.5 kg), last menstrual period 03/29/2016.  Urinalysis: Negative   HPI: The patient is being seen today for ongoing management of Class A2 DM. Today she reports CBG have been good the last 4 days   BP weight and urine results all reviewed and noted. Patient reports good fetal movement, denies any bleeding and no rupture of membranes symptoms or regular contractions.  Fundal Height:  U+22 Fetal Heart rate:  140 Edema:  none  Patient is without complaints other than noted in her HPI. All questions were answered.  All lab and sonogram results have been reviewed. Comments:    Assessment:  1.  Pregnancy at 2137w5d ,  Estimated Date of Delivery: 01/03/17 :                          2.  Class A2 DM                        3.  LGA, suspected  Medication(s) Plans:  Glyburide 5 am, 7.5 pm, Lantus 30 units 2200 Treatment Plan:  EFW in 1 week, twice weekly NST, deliver at 39 weeks  Return in about 4 days (around 12/08/2016) for BPP/sono, HROB. for appointment for high risk OB care  Orders Placed This Encounter  Procedures  . US OB Follow Up    Standing Status:   Future    Standing Expiration Date:   02/03/2018    Order Specific Question:   Reason for Exam (SYMPTOM  OR DIAGNOSIS REQUIRED)    Answer:   EFW    Order Specific Question:   Preferred imaging location?    Answer:   Internal  . US Fetal BPP W/O Non Stress    Standing Status:   Future    Standing Expiration Date:   02/03/2018    Order Specific Question:   Reason for Exam (SYMPTOM  OR DIAGNOSIS REQUIRED)    Answer:   a2 dm    Order Specific Question:   Preferred imaging location?    Answer:   Internal  . POCT urinalysis dipstick    Orders Placed This Encounter  Procedures  . US OB Follow Up  . US Fetal BPP W/O  Non Stress  . POCT urinalysis dipstick

## 2016-12-08 ENCOUNTER — Encounter: Payer: Self-pay | Admitting: Obstetrics & Gynecology

## 2016-12-08 ENCOUNTER — Ambulatory Visit (INDEPENDENT_AMBULATORY_CARE_PROVIDER_SITE_OTHER): Payer: BLUE CROSS/BLUE SHIELD

## 2016-12-08 ENCOUNTER — Ambulatory Visit (INDEPENDENT_AMBULATORY_CARE_PROVIDER_SITE_OTHER): Payer: BLUE CROSS/BLUE SHIELD | Admitting: Obstetrics & Gynecology

## 2016-12-08 VITALS — BP 126/78 | HR 83 | Wt 273.0 lb

## 2016-12-08 DIAGNOSIS — O3663X1 Maternal care for excessive fetal growth, third trimester, fetus 1: Secondary | ICD-10-CM

## 2016-12-08 DIAGNOSIS — Z3A36 36 weeks gestation of pregnancy: Secondary | ICD-10-CM | POA: Diagnosis not present

## 2016-12-08 DIAGNOSIS — O0990 Supervision of high risk pregnancy, unspecified, unspecified trimester: Secondary | ICD-10-CM

## 2016-12-08 DIAGNOSIS — O24419 Gestational diabetes mellitus in pregnancy, unspecified control: Secondary | ICD-10-CM

## 2016-12-08 DIAGNOSIS — Z1389 Encounter for screening for other disorder: Secondary | ICD-10-CM

## 2016-12-08 DIAGNOSIS — Z331 Pregnant state, incidental: Secondary | ICD-10-CM

## 2016-12-08 LAB — POCT URINALYSIS DIPSTICK
Blood, UA: NEGATIVE
Glucose, UA: NEGATIVE
KETONES UA: NEGATIVE
LEUKOCYTES UA: NEGATIVE
NITRITE UA: NEGATIVE
PROTEIN UA: NEGATIVE

## 2016-12-08 NOTE — Progress Notes (Signed)
US 36+2 wks,cephalic,BPP 8/8,normal ov's bilat,post pl gr 1,fhr 150 bpm,afi 14.5 cm,EFW 3759 g 95%,AC 99%,BPD/HC 99%,FL 54%

## 2016-12-08 NOTE — Progress Notes (Signed)
Fetal Surveillance Testing today:  BPP 8/8   High Risk Pregnancy Diagnosis(es):   Class A2/B DM, suspected fetal macrosomia  G1P0 764w2d Estimated Date of Delivery: 01/03/17  Blood pressure 126/78, pulse 83, weight 273 lb (123.8 kg), last menstrual period 03/29/2016.  Urinalysis: Negative   HPI: The patient is being seen today for ongoing management of Class A2/B DM. Today she reports CBG are decent   BP weight and urine results all reviewed and noted. Patient reports good fetal movement, denies any bleeding and no rupture of membranes symptoms or regular contractions.  Fundal Height:  U+26 Fetal Heart rate:  142 Edema:  none  Patient is without complaints other than noted in her HPI. All questions were answered.  All lab and sonogram results have been reviewed. Comments:    Assessment:  1.  Pregnancy at 3364w2d,  Estimated Date of Delivery: 01/03/17 :                          2.  Class A2/B DM                        3.  Suspected fetal macrosomia  Medication(s) Plans:  Glyburide 5 am 7.5 pm lantus 20 units  Treatment Plan:  Twice weekly NST, delivery(mode TBD)39 weeks  Return in about 3 days (around 12/11/2016) for NST, HROB. for appointment for high risk OB care  No orders of the defined types were placed in this encounter.  Orders Placed This Encounter  Procedures  . GC/Chlamydia Probe Amp  . Culture, beta strep (group b only)

## 2016-12-10 LAB — GC/CHLAMYDIA PROBE AMP
Chlamydia trachomatis, NAA: NEGATIVE
Neisseria gonorrhoeae by PCR: NEGATIVE

## 2016-12-11 ENCOUNTER — Other Ambulatory Visit: Payer: Self-pay | Admitting: Obstetrics & Gynecology

## 2016-12-11 ENCOUNTER — Ambulatory Visit (INDEPENDENT_AMBULATORY_CARE_PROVIDER_SITE_OTHER): Payer: BLUE CROSS/BLUE SHIELD | Admitting: Obstetrics and Gynecology

## 2016-12-11 ENCOUNTER — Encounter: Payer: Self-pay | Admitting: Obstetrics and Gynecology

## 2016-12-11 VITALS — BP 100/64 | HR 78 | Wt 275.5 lb

## 2016-12-11 DIAGNOSIS — Z1389 Encounter for screening for other disorder: Secondary | ICD-10-CM

## 2016-12-11 DIAGNOSIS — Z331 Pregnant state, incidental: Secondary | ICD-10-CM

## 2016-12-11 DIAGNOSIS — O0990 Supervision of high risk pregnancy, unspecified, unspecified trimester: Secondary | ICD-10-CM

## 2016-12-11 DIAGNOSIS — O24414 Gestational diabetes mellitus in pregnancy, insulin controlled: Secondary | ICD-10-CM

## 2016-12-11 LAB — POCT URINALYSIS DIPSTICK
GLUCOSE UA: NEGATIVE
Ketones, UA: NEGATIVE
LEUKOCYTES UA: NEGATIVE
NITRITE UA: NEGATIVE
Protein, UA: NEGATIVE

## 2016-12-11 NOTE — Progress Notes (Addendum)
High Risk Pregnancy HROB Diagnosis(es):   Class A2/B  G1P0 6562w5d Estimated Date of Delivery: 01/03/17    HPI: The patient is being seen today for ongoing management of class A2/B. Today she reports good  Patient reports good fetal movement, denies any bleeding and no rupture of membranes symptoms or regular contractions.   BP weight and urine results reviewed and noted. Blood pressure 100/64, pulse 78, weight 275 lb 8 oz (125 kg), last menstrual period 03/29/2016.  Fundal Height:  42, U+22 Fetal Heart rate:  140 with accelerations to 180 Physical Examination: Abdomen - soft, nontender, nondistended, no masses or organomegaly                                     Pelvic - examination not indicated                                     Edema:  none  Urinalysis: trace blood  Fetal Surveillance Testing today:  NST, reactive   Lab and sonogram results have been reviewed. Comments: Last fetal testing showed fetus is 8 lb 5 oz 3759 GM WITH AC 99%ILE  Assessment:  1.  Pregnancy at 6662w5d,  G1P0   :  Estimated Date of Delivery: 01/03/17                         2.  Class A2/B DM                        3. LGA infant   Medication(s) Plans:  Glyburide 5 AM 7.5 PM, lantus 20 units qhs  Treatment Plan:  Twice weekly NST, delivery at 39 weeks                              CONSIDER REPEAT OF EFW AT 38+WK Follow up in 4 days for appointment for high risk OB care,   By signing my name below, I, Sonum Patel, attest that this documentation has been prepared under the direction and in the presence of Tilda BurrowJohn Zelig Gacek V, MD. Electronically Signed: Sonum Patel, Scribe. 12/11/16. 12:32 PM.  I personally performed the services described in this documentation, which was SCRIBED in my presence. The recorded information has been reviewed and considered accurate. It has been edited as necessary during review. Tilda BurrowFERGUSON,Eriverto Byrnes V, MD

## 2016-12-12 LAB — CULTURE, BETA STREP (GROUP B ONLY): Strep Gp B Culture: NEGATIVE

## 2016-12-15 ENCOUNTER — Encounter: Payer: Self-pay | Admitting: Obstetrics & Gynecology

## 2016-12-15 ENCOUNTER — Ambulatory Visit (INDEPENDENT_AMBULATORY_CARE_PROVIDER_SITE_OTHER): Payer: BLUE CROSS/BLUE SHIELD

## 2016-12-15 ENCOUNTER — Ambulatory Visit (INDEPENDENT_AMBULATORY_CARE_PROVIDER_SITE_OTHER): Payer: BLUE CROSS/BLUE SHIELD | Admitting: Obstetrics & Gynecology

## 2016-12-15 VITALS — BP 112/74 | HR 84 | Wt 276.0 lb

## 2016-12-15 DIAGNOSIS — O24414 Gestational diabetes mellitus in pregnancy, insulin controlled: Secondary | ICD-10-CM

## 2016-12-15 DIAGNOSIS — O0990 Supervision of high risk pregnancy, unspecified, unspecified trimester: Secondary | ICD-10-CM

## 2016-12-15 DIAGNOSIS — O24419 Gestational diabetes mellitus in pregnancy, unspecified control: Secondary | ICD-10-CM

## 2016-12-15 DIAGNOSIS — Z331 Pregnant state, incidental: Secondary | ICD-10-CM

## 2016-12-15 DIAGNOSIS — Z1389 Encounter for screening for other disorder: Secondary | ICD-10-CM

## 2016-12-15 LAB — POCT URINALYSIS DIPSTICK
Blood, UA: NEGATIVE
Glucose, UA: NEGATIVE
Ketones, UA: NEGATIVE
LEUKOCYTES UA: NEGATIVE
NITRITE UA: NEGATIVE
PROTEIN UA: NEGATIVE

## 2016-12-15 NOTE — Progress Notes (Signed)
[place] 37+2 wks,cephalic,post pl gr 1,fhr 135 bpm,BPP 8/8,bilat adnexa's wnl,afi 17.3 cm,

## 2016-12-15 NOTE — Progress Notes (Signed)
Fetal Surveillance Testing today:  BPP 8/8   High Risk Pregnancy Diagnosis(es):   Class A2 DM with fetal macorosmia  G1P0 [redacted]w[redacted]d Estimated Date of Delivery: 01/03/17  Blood pressure 112/74, pulse 84, weight 276 lb (125.2 kg), last menstrual period 03/29/2016.  Urinalysis: Negative   HPI: The patient is being seen today for ongoing management of as above. Today she reports CBG reviewed and are good   BP weight and urine results all reviewed and noted. Patient reports good fetal movement, denies any bleeding and no rupture of membranes symptoms or regular contractions.  Fundal Height:  U+26 Fetal Heart rate:  140 Edema:  none  Patient is without complaints other than noted in her HPI. All questions were answered.  All lab and sonogram results have been reviewed. Comments:    Assessment:  1.  Pregnancy at [redacted]w[redacted]d,  Estimated Date of Delivery: 01/03/17 :                          2.  Class A2/B DM                        3.  Fetal macrosomia  Medication(s) Plans:  Glyburide 5 am 7.5 pm, lantus 20 qhs  Treatment Plan:  nst twice weekly, scheduled C section 39 weeks due to high presentation with EFW >99% tile  Return in about 3 days (around 12/18/2016) for NST, HROB, with Dr Despina Hidden. for appointment for high risk OB care  No orders of the defined types were placed in this encounter.  Orders Placed This Encounter  Procedures  . POCT urinalysis dipstick

## 2016-12-18 ENCOUNTER — Encounter: Payer: Self-pay | Admitting: Advanced Practice Midwife

## 2016-12-18 ENCOUNTER — Ambulatory Visit (INDEPENDENT_AMBULATORY_CARE_PROVIDER_SITE_OTHER): Payer: BLUE CROSS/BLUE SHIELD | Admitting: Advanced Practice Midwife

## 2016-12-18 VITALS — BP 104/70 | HR 76 | Wt 276.0 lb

## 2016-12-18 DIAGNOSIS — O24419 Gestational diabetes mellitus in pregnancy, unspecified control: Secondary | ICD-10-CM

## 2016-12-18 DIAGNOSIS — Z331 Pregnant state, incidental: Secondary | ICD-10-CM

## 2016-12-18 DIAGNOSIS — O0993 Supervision of high risk pregnancy, unspecified, third trimester: Secondary | ICD-10-CM

## 2016-12-18 DIAGNOSIS — Z1389 Encounter for screening for other disorder: Secondary | ICD-10-CM

## 2016-12-18 LAB — POCT URINALYSIS DIPSTICK
Blood, UA: NEGATIVE
GLUCOSE UA: NEGATIVE
KETONES UA: NEGATIVE
LEUKOCYTES UA: NEGATIVE
Nitrite, UA: NEGATIVE

## 2016-12-18 NOTE — Progress Notes (Signed)
Fetal Surveillance Testing today:  NST   High Risk Pregnancy Diagnosis(es):   A2/B DM  G1P0 [redacted]w[redacted]d Estimated Date of Delivery: 01/03/17  Blood pressure 104/70, pulse 76, weight 276 lb (125.2 kg), last menstrual period 03/29/2016.  Urinalysis: Neg  HPI: The patient is being seen today for ongoing management of the above. Today she reports FBS  and 2 hr PP ALL ARE GOOD   BP weight and urine results all reviewed and noted. Patient reports good fetal movement, denies any bleeding and no rupture of membranes symptoms or regular contractions.   Fetal Heart rate:  150  Edema:  1+  Patient is without complaints other than noted in her HPI. All questions were answered.  All lab and sonogram results have been reviewed. Comments: normal reactive NST  Assessment:  1.  Pregnancy at [redacted]w[redacted]d,  Estimated Date of Delivery: 01/03/17 :                          2.  A2/B DM, good control                        3.  Suspected LGA (BPD & AC >99% 36 weeks  Medication(s) Plans:  Glyburide 5 AM 7.5 PM, lantus 20 units qhs  Treatment Plan:  CS scheduled for 4/17 D/t LGA, baby not inpelvis per LHE  Return mon-THURS nst/hrob, for nst/HROB Mon-Thurs. for appointment for high risk OB care  No orders of the defined types were placed in this encounter.  Orders Placed This Encounter  Procedures  . US FETAL BPP WO NON STRESS  . US OB Follow Up  . POCT urinalysis dipstick

## 2016-12-22 ENCOUNTER — Encounter: Payer: Self-pay | Admitting: Women's Health

## 2016-12-22 ENCOUNTER — Ambulatory Visit (INDEPENDENT_AMBULATORY_CARE_PROVIDER_SITE_OTHER): Payer: BLUE CROSS/BLUE SHIELD | Admitting: Women's Health

## 2016-12-22 VITALS — BP 122/72 | HR 88 | Wt 279.0 lb

## 2016-12-22 DIAGNOSIS — Z331 Pregnant state, incidental: Secondary | ICD-10-CM

## 2016-12-22 DIAGNOSIS — O24419 Gestational diabetes mellitus in pregnancy, unspecified control: Secondary | ICD-10-CM | POA: Diagnosis not present

## 2016-12-22 DIAGNOSIS — O0993 Supervision of high risk pregnancy, unspecified, third trimester: Secondary | ICD-10-CM

## 2016-12-22 DIAGNOSIS — Z1389 Encounter for screening for other disorder: Secondary | ICD-10-CM

## 2016-12-22 LAB — POCT URINALYSIS DIPSTICK
Blood, UA: NEGATIVE
Glucose, UA: NEGATIVE
KETONES UA: NEGATIVE
Leukocytes, UA: NEGATIVE
NITRITE UA: NEGATIVE
PROTEIN UA: NEGATIVE

## 2016-12-22 NOTE — Patient Instructions (Signed)
Call the office ([phone]) or go to [facility] if:  You begin to have strong, frequent contractions  Your water breaks.  Sometimes it is a big gush of fluid, sometimes it is just a trickle that keeps getting your panties wet or running down your legs  You have vaginal bleeding.  It is normal to have a small amount of spotting if your cervix was checked.   You don't feel your baby moving like normal.  If you don't, get you something to eat and drink and lay down and focus on feeling your baby move.  You should feel at least 10 movements in 2 hours.  If you don't, you should call the office or go to [facility].     Braxton Hicks Contractions Contractions of the uterus can occur throughout pregnancy, but they are not always a sign that you are in labor. You may have practice contractions called Braxton Hicks contractions. These false labor contractions are sometimes confused with true labor. What are Braxton Hicks contractions? Braxton Hicks contractions are tightening movements that occur in the muscles of the uterus before labor. Unlike true labor contractions, these contractions do not result in opening (dilation) and thinning of the cervix. Toward the end of pregnancy (32-34 weeks), Braxton Hicks contractions can happen more often and may become stronger. These contractions are sometimes difficult to tell apart from true labor because they can be very uncomfortable. You should not feel embarrassed if you go to the hospital with false labor. Sometimes, the only way to tell if you are in true labor is for your health care provider to look for changes in the cervix. The health care provider will do a physical exam and may monitor your contractions. If you are not in true labor, the exam should show that your cervix is not dilating and your water has not broken. If there are no prenatal problems or other health problems associated with your pregnancy, it is completely safe for you to be sent  home with false labor. You may continue to have Braxton Hicks contractions until you go into true labor. How can I tell the difference between true labor and false labor?  Differences ? False labor ? Contractions last 30-70 seconds.: Contractions are usually shorter and not as strong as true labor contractions. ? Contractions become very regular.: Contractions are usually irregular. ? Discomfort is usually felt in the top of the uterus, and it spreads to the lower abdomen and low back.: Contractions are often felt in the front of the lower abdomen and in the groin. ? Contractions do not go away with walking.: Contractions may go away when you walk around or change positions while lying down. ? Contractions usually become more intense and increase in frequency.: Contractions get weaker and are shorter-lasting as time goes on. ? The cervix dilates and gets thinner.: The cervix usually does not dilate or become thin. Follow these instructions at home:  Take over-the-counter and prescription medicines only as told by your health care provider.  Keep up with your usual exercises and follow other instructions from your health care provider.  Eat and drink lightly if you think you are going into labor.  If Braxton Hicks contractions are making you uncomfortable: ? Change your position from lying down or resting to walking, or change from walking to resting. ? Sit and rest in a tub of warm water. ? Drink enough fluid to keep your urine clear or pale yellow. Dehydration may cause these contractions. ?   Do slow and deep breathing several times an hour.  Keep all follow-up prenatal visits as told by your health care provider. This is important. Contact a health care provider if:  You have a fever.  You have continuous pain in your abdomen. Get help right away if:  Your contractions become stronger, more regular, and closer together.  You have fluid leaking or gushing from your vagina.  You  pass blood-tinged mucus (bloody show).  You have bleeding from your vagina.  You have low back pain that you never had before.  You feel your baby's head pushing down and causing pelvic pressure.  Your baby is not moving inside you as much as it used to. Summary  Contractions that occur before labor are called Braxton Hicks contractions, false labor, or practice contractions.  Braxton Hicks contractions are usually shorter, weaker, farther apart, and less regular than true labor contractions. True labor contractions usually become progressively stronger and regular and they become more frequent.  Manage discomfort from Braxton Hicks contractions by changing position, resting in a warm bath, drinking plenty of water, or practicing deep breathing. This information is not intended to replace advice given to you by your health care provider. Make sure you discuss any questions you have with your health care provider. Document Released: 09/01/2005 Document Revised: 07/21/2016 Document Reviewed: 07/21/2016 Elsevier Interactive Patient Education  2017 [employer]  

## 2016-12-22 NOTE — Progress Notes (Signed)
High Risk Pregnancy Diagnosis(es): A2/BDM G1P0 [redacted]w[redacted]d Estimated Date of Delivery: 01/03/17 BP 122/72   Pulse 88   Wt 279 lb (126.6 kg)   LMP 03/29/2016 (Exact Date)   BMI 51.03 kg/m   Urinalysis: Negative HPI:  fbs all <90, 2hr after brunch all <120, all 2hr pp supper >120 (128-138)- states she eats more carbs at this meal. Checks TID (fasting, brunch, supper) d/t work schedules, etc.  BP, weight, and urine reviewed.  Reports good fm. Denies regular uc's, lof, vb, uti s/s. No complaints.  Fundal Height:  47 Fetal Heart rate:  150, reactive NST Edema:  None SVE: outer os ft/unable to reach inner os, ballotable  Reviewed labor s/s, fkc All questions were answered Assessment: [redacted]w[redacted]d A2/BDM Medication(s) Plans:  Glyburide 5am, 7.5pm, lantus 20u q hs. To decrease carbs at supper/increase protein- more like brunch meal to see if we can get 2hr pp sugars wnl. Don't want to increase am glyburide right now, b/c 2hr pp brunch are all normal Treatment Plan:  2x/wk NST, c/s 4/17 d/t suspected LGA/baby not in pelvis per LHE Follow up in 3d for high-risk OB appt and NST

## 2016-12-23 ENCOUNTER — Telehealth (HOSPITAL_COMMUNITY): Payer: Self-pay | Admitting: *Deleted

## 2016-12-23 LAB — OB RESULTS CONSOLE GBS: STREP GROUP B AG: NEGATIVE

## 2016-12-23 NOTE — Telephone Encounter (Signed)
Preadmission screen  

## 2016-12-24 ENCOUNTER — Encounter (HOSPITAL_COMMUNITY): Payer: Self-pay

## 2016-12-25 ENCOUNTER — Encounter: Payer: Self-pay | Admitting: Obstetrics and Gynecology

## 2016-12-25 ENCOUNTER — Encounter: Payer: Self-pay | Admitting: Obstetrics & Gynecology

## 2016-12-25 ENCOUNTER — Other Ambulatory Visit: Payer: BLUE CROSS/BLUE SHIELD | Admitting: Advanced Practice Midwife

## 2016-12-25 ENCOUNTER — Other Ambulatory Visit: Payer: Self-pay | Admitting: Obstetrics and Gynecology

## 2016-12-25 ENCOUNTER — Telehealth: Payer: Self-pay | Admitting: Obstetrics and Gynecology

## 2016-12-25 ENCOUNTER — Ambulatory Visit (INDEPENDENT_AMBULATORY_CARE_PROVIDER_SITE_OTHER): Payer: BLUE CROSS/BLUE SHIELD | Admitting: Obstetrics and Gynecology

## 2016-12-25 VITALS — BP 118/72 | HR 74 | Wt 278.4 lb

## 2016-12-25 DIAGNOSIS — O3663X0 Maternal care for excessive fetal growth, third trimester, not applicable or unspecified: Secondary | ICD-10-CM

## 2016-12-25 DIAGNOSIS — Z1389 Encounter for screening for other disorder: Secondary | ICD-10-CM

## 2016-12-25 DIAGNOSIS — Z331 Pregnant state, incidental: Secondary | ICD-10-CM

## 2016-12-25 DIAGNOSIS — O0990 Supervision of high risk pregnancy, unspecified, unspecified trimester: Secondary | ICD-10-CM | POA: Diagnosis not present

## 2016-12-25 LAB — POCT URINALYSIS DIPSTICK
Ketones, UA: NEGATIVE
Leukocytes, UA: NEGATIVE
NITRITE UA: NEGATIVE
Protein, UA: NEGATIVE
RBC UA: NEGATIVE

## 2016-12-25 NOTE — Telephone Encounter (Signed)
Patient's case next Tuesday will be switched to [name] as surgeon, and time changed to 12:30 to better fit office schedulling.  Cassandra [name] has been called and is okay with this change.

## 2016-12-25 NOTE — Progress Notes (Signed)
High Risk Pregnancy HROB Diagnosis(es):   A2/B DM, LGA infant, generally contracted pelvis.  G1P0 [redacted]w[redacted]d Estimated Date of Delivery: 01/03/17    HPI: The patient is being seen today for ongoing management of above. Today she has no complaints. Reports good DM control Patient reports good fetal movement, denies any bleeding and no rupture of membranes symptoms or regular contractions. She has been scheduled for a primary cesarean due to suspected LGA infant and presenting part that remains out of the pelvis.  BP weight and urine results reviewed and noted. Blood pressure 118/72, pulse 74, weight 278 lb 6.4 oz (126.3 kg), last menstrual period 03/29/2016.  Fundal Height:  48 Fetal Heart rate:  150 Physical Examination: Abdomen - soft, nontender, nondistended, no masses or organomegaly                                     Pelvic - examination not indicated                                     Edema:  unchanged  Urinalysis: trace glucose   Fetal Surveillance Testing today:  NST, reactive   Lab and sonogram results have been reviewed. Comments: growth u/s at 12/08/16             BIPARIETAL DIAMETER           10.2 cm         41+1 weeks  99%  HEAD CIRCUMFERENCE           35.93 cm   Out of range  99%  ABDOMINAL CIRCUMFERENCE           35.47 cm         39+2 weeks   99%  FEMUR LENGTH           7.1 cm         36+4 weeks    54%                                                           AVERAGE EGA(BY THIS SCAN):  39 weeks                                                 ESTIMATED FETAL WEIGHT:       3759  grams, 94 %   Assessment:  1.  Pregnancy at [redacted]w[redacted]d,  G1P0   :  Estimated Date of Delivery: 01/03/17                         2.  A2/B DM                        3. LGA infant, suspected narrow pelvis   Medication(s) Plans: Glyburide 5am, 7.5pm, lantus 20u q hs  Treatment Plan:  Indicated primary c section on 4/17  for fetal macrosomia. Twice weekly testing.   Follow up in 4  days for appointment for high risk  OB care, BPP Dr Emelda Fear will be assuming responsibility for scheduling and performing primary cesarean on 12/30/16. By signing my name below, I, Sonum Patel, attest that this documentation has been prepared under the direction and in the presence of Tilda Burrow, MD. Electronically Signed: Sonum Patel, Neurosurgeon. 12/25/16. 10:55 AM.  I personally performed the services described in this documentation, which was SCRIBED in my presence. The recorded information has been reviewed and considered accurate. It has been edited as necessary during review. Tilda Burrow, MD

## 2016-12-28 ENCOUNTER — Other Ambulatory Visit: Payer: Self-pay | Admitting: Obstetrics and Gynecology

## 2016-12-28 NOTE — Progress Notes (Signed)
Cassandra [name] is a 26 y.o. female presenting for primary cesarean section at 39 wk 3 days for a LGA infant above 97%ile on HC, AC, and presenting part out of the pelvis, in a pregnancy notable for A2/B diabetes mellitus.  Pt has been counselled over our concern, examined and the presenting part is completely out of the pelvis.  Primary section offered as recommended option, and pt completely supports this approach.. OB History    Gravida Para Term Preterm AB Living   1             SAB TAB Ectopic Multiple Live Births                 Past Medical History:  Diagnosis Date  . Anovulation   . Bipolar 1 disorder (HCC)    no medication  . Contraceptive management 11/22/2013  . Diabetes mellitus without complication (HCC)   . Encounter for Implanon removal 11/22/2013   Could not remove today will try again in 2 weeks, has ben in since 2010 and pt has gained weight  . Hx of migraines   . Infertility, female   . Irregular intermenstrual bleeding 12/13/2014  . Irregular periods 09/26/2014   Spots no true flow in 6 years  . Obesity   . PCO (polycystic ovaries) 10/05/2014   Past Surgical History:  Procedure Laterality Date  . WISDOM TOOTH EXTRACTION     Family History: family history includes COPD in her mother; Cancer in her maternal grandfather and maternal grandmother; Emphysema in her mother; Hypertension in her mother. Social History:  reports that she has quit smoking. Her smoking use included Cigars. She has never used smokeless tobacco. She reports that she does not drink alcohol or use drugs.     Maternal Diabetes: Yes:  Diabetes Type:  Insulin/Medication controlled Genetic Screening: Normal Maternal Ultrasounds/Referrals: Abnormal:  Findings:   Other: LGA Fetal Ultrasounds or other Referrals:  None Maternal Substance Abuse:  No Significant Maternal Medications:  Meds include: Other: GLYBURIDE  Significant Maternal Lab Results:  Lab values include: Group B Strep  negative Other Comments:  None  ROS History   Last menstrual period 03/29/2016. Exam Physical Exam  Constitutional: She appears well-developed and well-nourished.  overweght There is no height or weight on file to calculate BMI.\  Neck: Normal range of motion.  Cardiovascular: Normal rate.   Respiratory: Effort normal.  GI: Soft.  Gravid uterus, 48 cm FH and presenting part out of pelvis.  Large pannus will require Traxi.    Prenatal labs: ABO, Rh: O/Positive/-- (09/28 0914) Antibody: Negative (01/22 1159) Rubella: 2.09 (09/28 0914) RPR: Non Reactive (01/22 1159)  HBsAg: Negative (09/28 0914)  HIV: Non Reactive (01/22 1159)  GBS: Negative (04/10 0000)   Assessment/Plan: Pregnancy 39w3 d Lga infant, and generally contracted pelvis for this size infant.  Primary cesarean section on 12/30/16   Tilda Burrow 12/28/2016, 8:02 PM

## 2016-12-29 ENCOUNTER — Other Ambulatory Visit: Payer: BLUE CROSS/BLUE SHIELD | Admitting: Obstetrics & Gynecology

## 2016-12-29 ENCOUNTER — Encounter: Payer: Self-pay | Admitting: Obstetrics & Gynecology

## 2016-12-29 ENCOUNTER — Encounter (HOSPITAL_COMMUNITY)
Admission: RE | Admit: 2016-12-29 | Discharge: 2016-12-29 | Disposition: A | Discharge status: Home / Self Care | Payer: BLUE CROSS/BLUE SHIELD | Source: Ambulatory Visit | Attending: Obstetrics & Gynecology | Admitting: Obstetrics & Gynecology

## 2016-12-29 ENCOUNTER — Ambulatory Visit (INDEPENDENT_AMBULATORY_CARE_PROVIDER_SITE_OTHER): Payer: BLUE CROSS/BLUE SHIELD | Admitting: Obstetrics & Gynecology

## 2016-12-29 VITALS — BP 130/80 | HR 80 | Wt 278.0 lb

## 2016-12-29 DIAGNOSIS — O24429 Gestational diabetes mellitus in childbirth, unspecified control: Secondary | ICD-10-CM | POA: Diagnosis not present

## 2016-12-29 DIAGNOSIS — Z412 Encounter for routine and ritual male circumcision: Secondary | ICD-10-CM | POA: Diagnosis not present

## 2016-12-29 DIAGNOSIS — O99344 Other mental disorders complicating childbirth: Secondary | ICD-10-CM | POA: Diagnosis not present

## 2016-12-29 DIAGNOSIS — Z818 Family history of other mental and behavioral disorders: Secondary | ICD-10-CM | POA: Diagnosis not present

## 2016-12-29 DIAGNOSIS — Z1389 Encounter for screening for other disorder: Secondary | ICD-10-CM

## 2016-12-29 DIAGNOSIS — O3662X0 Maternal care for excessive fetal growth, second trimester, not applicable or unspecified: Secondary | ICD-10-CM | POA: Diagnosis not present

## 2016-12-29 DIAGNOSIS — O24419 Gestational diabetes mellitus in pregnancy, unspecified control: Secondary | ICD-10-CM | POA: Diagnosis not present

## 2016-12-29 DIAGNOSIS — O0993 Supervision of high risk pregnancy, unspecified, third trimester: Secondary | ICD-10-CM

## 2016-12-29 DIAGNOSIS — Z87891 Personal history of nicotine dependence: Secondary | ICD-10-CM | POA: Diagnosis not present

## 2016-12-29 DIAGNOSIS — O3660X1 Maternal care for excessive fetal growth, unspecified trimester, fetus 1: Secondary | ICD-10-CM | POA: Diagnosis not present

## 2016-12-29 DIAGNOSIS — O9962 Diseases of the digestive system complicating childbirth: Secondary | ICD-10-CM | POA: Diagnosis not present

## 2016-12-29 DIAGNOSIS — F319 Bipolar disorder, unspecified: Secondary | ICD-10-CM | POA: Diagnosis not present

## 2016-12-29 DIAGNOSIS — Z3A39 39 weeks gestation of pregnancy: Secondary | ICD-10-CM | POA: Diagnosis not present

## 2016-12-29 DIAGNOSIS — O3663X0 Maternal care for excessive fetal growth, third trimester, not applicable or unspecified: Secondary | ICD-10-CM | POA: Diagnosis not present

## 2016-12-29 DIAGNOSIS — O99214 Obesity complicating childbirth: Secondary | ICD-10-CM | POA: Diagnosis not present

## 2016-12-29 DIAGNOSIS — Z331 Pregnant state, incidental: Secondary | ICD-10-CM

## 2016-12-29 DIAGNOSIS — Z6841 Body Mass Index (BMI) 40.0 and over, adult: Secondary | ICD-10-CM | POA: Diagnosis not present

## 2016-12-29 DIAGNOSIS — K219 Gastro-esophageal reflux disease without esophagitis: Secondary | ICD-10-CM | POA: Diagnosis not present

## 2016-12-29 DIAGNOSIS — Z833 Family history of diabetes mellitus: Secondary | ICD-10-CM | POA: Diagnosis not present

## 2016-12-29 DIAGNOSIS — O24425 Gestational diabetes mellitus in childbirth, controlled by oral hypoglycemic drugs: Secondary | ICD-10-CM | POA: Diagnosis not present

## 2016-12-29 HISTORY — DX: Type 2 diabetes mellitus without complications: E11.9

## 2016-12-29 LAB — CBC
HCT: 41.5 % (ref 36.0–46.0)
HEMOGLOBIN: 14 g/dL (ref 12.0–15.0)
MCH: 29.5 pg (ref 26.0–34.0)
MCHC: 33.7 g/dL (ref 30.0–36.0)
MCV: 87.6 fL (ref 78.0–100.0)
PLATELETS: 198 10*3/uL (ref 150–400)
RBC: 4.74 MIL/uL (ref 3.87–5.11)
RDW: 14.5 % (ref 11.5–15.5)
WBC: 7.7 10*3/uL (ref 4.0–10.5)

## 2016-12-29 LAB — URINALYSIS, ROUTINE W REFLEX MICROSCOPIC
Bilirubin Urine: NEGATIVE
Glucose, UA: NEGATIVE mg/dL
Hgb urine dipstick: NEGATIVE
Ketones, ur: NEGATIVE mg/dL
Leukocytes, UA: NEGATIVE
NITRITE: NEGATIVE
Protein, ur: NEGATIVE mg/dL
SPECIFIC GRAVITY, URINE: 1.012 (ref 1.005–1.030)
pH: 6 (ref 5.0–8.0)

## 2016-12-29 LAB — TYPE AND SCREEN
ABO/RH(D): O POS
Antibody Screen: NEGATIVE

## 2016-12-29 LAB — POCT URINALYSIS DIPSTICK
Blood, UA: NEGATIVE
Glucose, UA: NEGATIVE
Ketones, UA: NEGATIVE
LEUKOCYTES UA: NEGATIVE
NITRITE UA: NEGATIVE

## 2016-12-29 LAB — ABO/RH: ABO/RH(D): O POS

## 2016-12-29 NOTE — Patient Instructions (Signed)
20 Cassandra [name]  12/29/2016   Your procedure is scheduled on:  12/30/2016  Enter through the Main Entrance of [facility] at 1045 AM.  Pick up the phone at the desk and dial [phone].   Call this number if you have problems the morning of surgery: [phone]   Remember:   Do not eat food:After Midnight.  Do not drink clear liquids: After Midnight.  Take these medicines the morning of surgery with A SIP OF WATER: take 10u lantus insulin SQ at bedtime instead of 20u.  DO NOT TAKE ANY MEDICINES IN THE MORNING.     Do not wear jewelry, make-up or nail polish.  Do not wear lotions, powders, or perfumes. Do not wear deodorant.  Do not shave 48 hours prior to surgery.  Do not bring valuables to the hospital.  [facility] is not   responsible for any belongings or valuables brought to the hospital.  Contacts, dentures or bridgework may not be worn into surgery.  Leave suitcase in the car. After surgery it may be brought to your room.  For patients admitted to the hospital, checkout time is 11:00 AM the day of              discharge.   Patients discharged the day of surgery will not be allowed to drive             home.  Name and phone number of your driver: na  Special Instructions:   N/A   Please read over the following fact sheets that you were given:   Surgical Site Infection Prevention

## 2016-12-29 NOTE — Pre-Procedure Instructions (Signed)
Called Dr Emelda Fear for medication instructions for patient.  Instructed by Dr Emelda Fear to have pt take half of her lantus insulin at bedtime and no further medication in the morning.  Stated current dose was 20u lantus SQ at bedtime.  To be reduced to 10u SQ at bedtime.  Pt instructed with this information on preop instruction sheet at time of preop visit.

## 2016-12-29 NOTE — Progress Notes (Signed)
Fetal Surveillance Testing today:  Reactive NST   High Risk Pregnancy Diagnosis(es):   Class A2/B DM  G1P0 [redacted]w[redacted]d Estimated Date of Delivery: 01/03/17  Blood pressure 130/80, pulse 80, weight 278 lb (126.1 kg), last menstrual period 03/29/2016.  Urinalysis: Positive for trace protein   HPI: The patient is being seen today for ongoing management of Class A2/B DM. Today she reports CBGgood, pt brought her log   BP weight and urine results all reviewed and noted. Patient reports good fetal movement, denies any bleeding and no rupture of membranes symptoms or regular contractions.  Fundal Height:  U+27 Fetal Heart rate:  135 Edema:  !+  Patient is without complaints other than noted in her HPI. All questions were answered.  All lab and sonogram results have been reviewed. Comments:    Assessment:  1.  Pregnancy at [redacted]w[redacted]d,  Estimated Date of Delivery: 01/03/17 :                          2.  Class A2/B DM                        3.  Suspected Fetal macrosomia  Medication(s) Plans:  Per instructions, for C section tomorrow  Treatment Plan:  scheduled for c section tomorrow, Dr Emelda Fear to do  Return in about 9 days (around 01/07/2017) for Post Op with Dr Emelda Fear. for appointment for high risk OB care  No orders of the defined types were placed in this encounter.  Orders Placed This Encounter  Procedures  . POCT urinalysis dipstick

## 2016-12-30 ENCOUNTER — Inpatient Hospital Stay (HOSPITAL_COMMUNITY)
Admission: RE | Admit: 2016-12-30 | Discharge: 2017-01-01 | DRG: 765 | Disposition: A | Discharge status: Home / Self Care | Payer: BLUE CROSS/BLUE SHIELD | Source: Ambulatory Visit | Attending: Family Medicine | Admitting: Family Medicine

## 2016-12-30 ENCOUNTER — Inpatient Hospital Stay (HOSPITAL_COMMUNITY): Payer: BLUE CROSS/BLUE SHIELD | Admitting: Anesthesiology

## 2016-12-30 ENCOUNTER — Encounter (HOSPITAL_COMMUNITY): Payer: Self-pay | Admitting: *Deleted

## 2016-12-30 ENCOUNTER — Encounter (HOSPITAL_COMMUNITY)
Admission: RE | Disposition: A | Discharge status: Home / Self Care | Payer: Self-pay | Source: Ambulatory Visit | Attending: Family Medicine

## 2016-12-30 DIAGNOSIS — Z3A39 39 weeks gestation of pregnancy: Secondary | ICD-10-CM

## 2016-12-30 DIAGNOSIS — O3660X1 Maternal care for excessive fetal growth, unspecified trimester, fetus 1: Secondary | ICD-10-CM | POA: Diagnosis not present

## 2016-12-30 DIAGNOSIS — Z6841 Body Mass Index (BMI) 40.0 and over, adult: Secondary | ICD-10-CM | POA: Diagnosis not present

## 2016-12-30 DIAGNOSIS — Z98891 History of uterine scar from previous surgery: Secondary | ICD-10-CM

## 2016-12-30 DIAGNOSIS — F319 Bipolar disorder, unspecified: Secondary | ICD-10-CM | POA: Diagnosis present

## 2016-12-30 DIAGNOSIS — O3663X0 Maternal care for excessive fetal growth, third trimester, not applicable or unspecified: Principal | ICD-10-CM | POA: Diagnosis present

## 2016-12-30 DIAGNOSIS — O9962 Diseases of the digestive system complicating childbirth: Secondary | ICD-10-CM | POA: Diagnosis present

## 2016-12-30 DIAGNOSIS — O24429 Gestational diabetes mellitus in childbirth, unspecified control: Secondary | ICD-10-CM

## 2016-12-30 DIAGNOSIS — Z833 Family history of diabetes mellitus: Secondary | ICD-10-CM | POA: Diagnosis not present

## 2016-12-30 DIAGNOSIS — Z87891 Personal history of nicotine dependence: Secondary | ICD-10-CM | POA: Diagnosis not present

## 2016-12-30 DIAGNOSIS — O99344 Other mental disorders complicating childbirth: Secondary | ICD-10-CM | POA: Diagnosis present

## 2016-12-30 DIAGNOSIS — O3662X0 Maternal care for excessive fetal growth, second trimester, not applicable or unspecified: Secondary | ICD-10-CM

## 2016-12-30 DIAGNOSIS — O99214 Obesity complicating childbirth: Secondary | ICD-10-CM | POA: Diagnosis present

## 2016-12-30 DIAGNOSIS — Z412 Encounter for routine and ritual male circumcision: Secondary | ICD-10-CM | POA: Diagnosis not present

## 2016-12-30 DIAGNOSIS — K219 Gastro-esophageal reflux disease without esophagitis: Secondary | ICD-10-CM | POA: Diagnosis present

## 2016-12-30 DIAGNOSIS — O24425 Gestational diabetes mellitus in childbirth, controlled by oral hypoglycemic drugs: Secondary | ICD-10-CM | POA: Diagnosis present

## 2016-12-30 DIAGNOSIS — Z8632 Personal history of gestational diabetes: Secondary | ICD-10-CM

## 2016-12-30 DIAGNOSIS — Z818 Family history of other mental and behavioral disorders: Secondary | ICD-10-CM | POA: Diagnosis not present

## 2016-12-30 LAB — RPR: RPR: NONREACTIVE

## 2016-12-30 LAB — GLUCOSE, CAPILLARY
Glucose-Capillary: 65 mg/dL (ref 65–99)
Glucose-Capillary: 77 mg/dL (ref 65–99)

## 2016-12-30 SURGERY — Surgical Case
Anesthesia: Monitor Anesthesia Care | Site: Abdomen | Wound class: Clean Contaminated

## 2016-12-30 MED ORDER — OXYCODONE HCL 5 MG PO TABS: 10.0000 mg | ORAL_TABLET | ORAL | Status: DC | PRN

## 2016-12-30 MED ORDER — OXYTOCIN 10 UNIT/ML IJ SOLN
INTRAMUSCULAR | Status: AC
  Filled 2016-12-30: qty 4

## 2016-12-30 MED ORDER — IBUPROFEN 600 MG PO TABS
600.0000 mg | ORAL_TABLET | Freq: Four times a day (QID) | ORAL | Status: DC
  Administered 2016-12-30 – 2017-01-01 (×7): 600 mg via ORAL
  Filled 2016-12-30 (×7): qty 1

## 2016-12-30 MED ORDER — PROMETHAZINE HCL 25 MG/ML IJ SOLN
INTRAMUSCULAR | Status: AC
  Filled 2016-12-30: qty 1

## 2016-12-30 MED ORDER — ONDANSETRON HCL 4 MG/2ML IJ SOLN
INTRAMUSCULAR | Status: AC
  Filled 2016-12-30: qty 2

## 2016-12-30 MED ORDER — ACETAMINOPHEN 325 MG PO TABS
650.0000 mg | ORAL_TABLET | ORAL | Status: DC | PRN
  Administered 2016-12-31 (×3): 650 mg via ORAL
  Filled 2016-12-30 (×3): qty 2

## 2016-12-30 MED ORDER — NALBUPHINE HCL 10 MG/ML IJ SOLN: 5.0000 mg | Freq: Once | INTRAMUSCULAR | Status: DC | PRN

## 2016-12-30 MED ORDER — KETOROLAC TROMETHAMINE 30 MG/ML IJ SOLN
30.0000 mg | Freq: Four times a day (QID) | INTRAMUSCULAR | Status: DC | PRN
  Administered 2016-12-30: 30 mg via INTRAMUSCULAR

## 2016-12-30 MED ORDER — OXYTOCIN 10 UNIT/ML IJ SOLN
INTRAVENOUS | Status: DC | PRN
  Administered 2016-12-30: 40 [IU] via INTRAVENOUS

## 2016-12-30 MED ORDER — OXYTOCIN 40 UNITS IN LACTATED RINGERS INFUSION - SIMPLE MED: 2.5000 [IU]/h | INTRAVENOUS | Status: AC

## 2016-12-30 MED ORDER — LACTATED RINGERS IV SOLN
INTRAVENOUS | Status: DC | PRN
  Administered 2016-12-30 (×3): via INTRAVENOUS

## 2016-12-30 MED ORDER — KETOROLAC TROMETHAMINE 30 MG/ML IJ SOLN: 30.0000 mg | Freq: Four times a day (QID) | INTRAMUSCULAR | Status: DC | PRN

## 2016-12-30 MED ORDER — NALOXONE HCL 2 MG/2ML IJ SOSY
1.0000 ug/kg/h | PREFILLED_SYRINGE | INTRAVENOUS | Status: DC | PRN
  Filled 2016-12-30: qty 2

## 2016-12-30 MED ORDER — MENTHOL 3 MG MT LOZG: 1.0000 | LOZENGE | OROMUCOSAL | Status: DC | PRN

## 2016-12-30 MED ORDER — FENTANYL CITRATE (PF) 100 MCG/2ML IJ SOLN
INTRAMUSCULAR | Status: DC | PRN
  Administered 2016-12-30: 100 ug via INTRAVENOUS

## 2016-12-30 MED ORDER — PHENYLEPHRINE HCL 10 MG/ML IJ SOLN
INTRAMUSCULAR | Status: DC | PRN
  Administered 2016-12-30 (×12): 80 ug via INTRAVENOUS

## 2016-12-30 MED ORDER — ZOLPIDEM TARTRATE 5 MG PO TABS: 5.0000 mg | ORAL_TABLET | Freq: Every evening | ORAL | Status: DC | PRN

## 2016-12-30 MED ORDER — SIMETHICONE 80 MG PO CHEW
80.0000 mg | CHEWABLE_TABLET | Freq: Three times a day (TID) | ORAL | Status: DC
  Administered 2016-12-31 – 2017-01-01 (×5): 80 mg via ORAL
  Filled 2016-12-30 (×5): qty 1

## 2016-12-30 MED ORDER — SIMETHICONE 80 MG PO CHEW: 80.0000 mg | CHEWABLE_TABLET | ORAL | Status: DC | PRN

## 2016-12-30 MED ORDER — HYDROMORPHONE HCL 1 MG/ML IJ SOLN
0.2500 mg | INTRAMUSCULAR | Status: DC | PRN
  Administered 2016-12-30: 0.25 mg via INTRAVENOUS
  Administered 2016-12-30: 0.5 mg via INTRAVENOUS
  Administered 2016-12-30 (×2): 0.25 mg via INTRAVENOUS

## 2016-12-30 MED ORDER — PROMETHAZINE HCL 25 MG/ML IJ SOLN
6.2500 mg | INTRAMUSCULAR | Status: DC | PRN
  Administered 2016-12-30: 6.25 mg via INTRAVENOUS

## 2016-12-30 MED ORDER — WITCH HAZEL-GLYCERIN EX PADS: 1.0000 "application " | MEDICATED_PAD | CUTANEOUS | Status: DC | PRN

## 2016-12-30 MED ORDER — DIPHENHYDRAMINE HCL 25 MG PO CAPS
25.0000 mg | ORAL_CAPSULE | ORAL | Status: DC | PRN
  Filled 2016-12-30: qty 1

## 2016-12-30 MED ORDER — MEPERIDINE HCL 25 MG/ML IJ SOLN: 6.2500 mg | INTRAMUSCULAR | Status: DC | PRN

## 2016-12-30 MED ORDER — HYDROMORPHONE HCL 1 MG/ML IJ SOLN
INTRAMUSCULAR | Status: AC
  Filled 2016-12-30: qty 1

## 2016-12-30 MED ORDER — ONDANSETRON HCL 4 MG/2ML IJ SOLN
INTRAMUSCULAR | Status: DC | PRN
  Administered 2016-12-30: 4 mg via INTRAVENOUS

## 2016-12-30 MED ORDER — SODIUM BICARBONATE 8.4 % IV SOLN
INTRAVENOUS | Status: DC | PRN
  Administered 2016-12-30: 3 mL via EPIDURAL
  Administered 2016-12-30 (×2): 2 mL via EPIDURAL
  Administered 2016-12-30: 3 mL via EPIDURAL
  Administered 2016-12-30 (×2): 5 mL via EPIDURAL

## 2016-12-30 MED ORDER — MORPHINE SULFATE (PF) 0.5 MG/ML IJ SOLN
INTRAMUSCULAR | Status: AC
  Filled 2016-12-30: qty 10

## 2016-12-30 MED ORDER — FENTANYL CITRATE (PF) 100 MCG/2ML IJ SOLN
INTRAMUSCULAR | Status: AC
  Filled 2016-12-30: qty 2

## 2016-12-30 MED ORDER — NALBUPHINE HCL 10 MG/ML IJ SOLN: 5.0000 mg | INTRAMUSCULAR | Status: DC | PRN

## 2016-12-30 MED ORDER — SODIUM BICARBONATE 8.4 % IV SOLN
INTRAVENOUS | Status: AC
  Filled 2016-12-30: qty 50

## 2016-12-30 MED ORDER — DIPHENHYDRAMINE HCL 50 MG/ML IJ SOLN: 12.5000 mg | INTRAMUSCULAR | Status: DC | PRN

## 2016-12-30 MED ORDER — OXYCODONE HCL 5 MG PO TABS
5.0000 mg | ORAL_TABLET | ORAL | Status: DC | PRN
  Administered 2016-12-31 (×3): 5 mg via ORAL
  Filled 2016-12-30 (×3): qty 1

## 2016-12-30 MED ORDER — ONDANSETRON HCL 4 MG/2ML IJ SOLN: 4.0000 mg | Freq: Three times a day (TID) | INTRAMUSCULAR | Status: DC | PRN

## 2016-12-30 MED ORDER — COCONUT OIL OIL: 1.0000 "application " | TOPICAL_OIL | Status: DC | PRN

## 2016-12-30 MED ORDER — SENNOSIDES-DOCUSATE SODIUM 8.6-50 MG PO TABS
2.0000 | ORAL_TABLET | ORAL | Status: DC
  Administered 2016-12-30 – 2016-12-31 (×2): 2 via ORAL
  Filled 2016-12-30 (×2): qty 2

## 2016-12-30 MED ORDER — PRENATAL MULTIVITAMIN CH
1.0000 | ORAL_TABLET | Freq: Every day | ORAL | Status: DC
  Administered 2016-12-31 – 2017-01-01 (×2): 1 via ORAL
  Filled 2016-12-30 (×2): qty 1

## 2016-12-30 MED ORDER — NALOXONE HCL 0.4 MG/ML IJ SOLN: 0.4000 mg | INTRAMUSCULAR | Status: DC | PRN

## 2016-12-30 MED ORDER — CEFAZOLIN SODIUM 10 G IJ SOLR
3.0000 g | INTRAMUSCULAR | Status: AC
  Administered 2016-12-30: 3 g via INTRAVENOUS
  Filled 2016-12-30 (×2): qty 3000

## 2016-12-30 MED ORDER — DIPHENHYDRAMINE HCL 25 MG PO CAPS: 25.0000 mg | ORAL_CAPSULE | Freq: Four times a day (QID) | ORAL | Status: DC | PRN

## 2016-12-30 MED ORDER — DIBUCAINE 1 % RE OINT: 1.0000 "application " | TOPICAL_OINTMENT | RECTAL | Status: DC | PRN

## 2016-12-30 MED ORDER — MORPHINE SULFATE (PF) 0.5 MG/ML IJ SOLN
INTRAMUSCULAR | Status: DC | PRN
  Administered 2016-12-30: 4 mg via EPIDURAL
  Administered 2016-12-30: 1 mg via INTRAVENOUS

## 2016-12-30 MED ORDER — SIMETHICONE 80 MG PO CHEW
80.0000 mg | CHEWABLE_TABLET | ORAL | Status: DC
  Administered 2016-12-30 – 2016-12-31 (×2): 80 mg via ORAL
  Filled 2016-12-30 (×2): qty 1

## 2016-12-30 MED ORDER — LIDOCAINE-EPINEPHRINE (PF) 2 %-1:200000 IJ SOLN
INTRAMUSCULAR | Status: AC
  Filled 2016-12-30: qty 20

## 2016-12-30 MED ORDER — SCOPOLAMINE 1 MG/3DAYS TD PT72
MEDICATED_PATCH | TRANSDERMAL | Status: AC
  Filled 2016-12-30: qty 1

## 2016-12-30 MED ORDER — SODIUM CHLORIDE 0.9% FLUSH: 3.0000 mL | INTRAVENOUS | Status: DC | PRN

## 2016-12-30 MED ORDER — SCOPOLAMINE 1 MG/3DAYS TD PT72
MEDICATED_PATCH | TRANSDERMAL | Status: DC | PRN
  Administered 2016-12-30: 1 via TRANSDERMAL

## 2016-12-30 MED ORDER — PHENYLEPHRINE 40 MCG/ML (10ML) SYRINGE FOR IV PUSH (FOR BLOOD PRESSURE SUPPORT)
PREFILLED_SYRINGE | INTRAVENOUS | Status: AC
  Filled 2016-12-30: qty 20

## 2016-12-30 MED ORDER — LACTATED RINGERS IV SOLN
INTRAVENOUS | Status: DC | PRN
  Administered 2016-12-30: 14:00:00 via INTRAVENOUS

## 2016-12-30 MED ORDER — LACTATED RINGERS IV SOLN
INTRAVENOUS | Status: DC
Start: 2016-12-30 — End: 2017-01-01
  Administered 2016-12-30: 19:00:00 via INTRAVENOUS

## 2016-12-30 MED ORDER — TETANUS-DIPHTH-ACELL PERTUSSIS 5-2.5-18.5 LF-MCG/0.5 IM SUSP: 0.5000 mL | Freq: Once | INTRAMUSCULAR | Status: DC

## 2016-12-30 MED ORDER — CHLOROPROCAINE HCL (PF) 3 % IJ SOLN
INTRAMUSCULAR | Status: AC
  Filled 2016-12-30: qty 20

## 2016-12-30 MED ORDER — SCOPOLAMINE 1 MG/3DAYS TD PT72: 1.0000 | MEDICATED_PATCH | Freq: Once | TRANSDERMAL | Status: DC

## 2016-12-30 MED ORDER — CHLOROPROCAINE HCL (PF) 3 % IJ SOLN
INTRAMUSCULAR | Status: DC | PRN
  Administered 2016-12-30: 5 mL

## 2016-12-30 MED ORDER — PHENYLEPHRINE 40 MCG/ML (10ML) SYRINGE FOR IV PUSH (FOR BLOOD PRESSURE SUPPORT)
PREFILLED_SYRINGE | INTRAVENOUS | Status: AC
  Filled 2016-12-30: qty 10

## 2016-12-30 MED ORDER — KETOROLAC TROMETHAMINE 30 MG/ML IJ SOLN
INTRAMUSCULAR | Status: AC
  Filled 2016-12-30: qty 1

## 2016-12-30 SURGICAL SUPPLY — 37 items
BENZOIN TINCTURE PRP APPL 2/3 (GAUZE/BANDAGES/DRESSINGS) IMPLANT
CHLORAPREP W/TINT 26ML (MISCELLANEOUS) ×3 IMPLANT
CLAMP CORD UMBIL (MISCELLANEOUS) IMPLANT
CLOSURE WOUND 1/2 X4 (GAUZE/BANDAGES/DRESSINGS)
CLOTH BEACON ORANGE TIMEOUT ST (SAFETY) ×3 IMPLANT
DRESSING DISP NPWT PICO 4X12 (MISCELLANEOUS) ×3 IMPLANT
DRSG OPSITE POSTOP 4X10 (GAUZE/BANDAGES/DRESSINGS) ×3 IMPLANT
ELECT REM PT RETURN 9FT ADLT (ELECTROSURGICAL) ×3
ELECTRODE REM PT RTRN 9FT ADLT (ELECTROSURGICAL) ×1 IMPLANT
EXTRACTOR VACUUM KIWI (MISCELLANEOUS) IMPLANT
GLOVE BIO SURGEON ST LM GN SZ9 (GLOVE) ×3 IMPLANT
GLOVE BIOGEL PI IND STRL 7.0 (GLOVE) ×1 IMPLANT
GLOVE BIOGEL PI IND STRL 9 (GLOVE) ×1 IMPLANT
GLOVE BIOGEL PI INDICATOR 7.0 (GLOVE) ×2
GLOVE BIOGEL PI INDICATOR 9 (GLOVE) ×2
GOWN STRL REUS W/TWL 2XL LVL3 (GOWN DISPOSABLE) ×3 IMPLANT
GOWN STRL REUS W/TWL LRG LVL3 (GOWN DISPOSABLE) ×3 IMPLANT
NEEDLE HYPO 25X5/8 SAFETYGLIDE (NEEDLE) IMPLANT
NS IRRIG 1000ML POUR BTL (IV SOLUTION) ×3 IMPLANT
PACK C SECTION WH (CUSTOM PROCEDURE TRAY) ×3 IMPLANT
PAD OB MATERNITY 4.3X12.25 (PERSONAL CARE ITEMS) ×3 IMPLANT
PENCIL SMOKE EVAC W/HOLSTER (ELECTROSURGICAL) ×3 IMPLANT
RTRCTR C-SECT PINK 25CM LRG (MISCELLANEOUS) IMPLANT
RTRCTR C-SECT PINK 34CM XLRG (MISCELLANEOUS) IMPLANT
STRIP CLOSURE SKIN 1/2X4 (GAUZE/BANDAGES/DRESSINGS) IMPLANT
SUT MNCRL 0 VIOLET CTX 36 (SUTURE) ×2 IMPLANT
SUT MONOCRYL 0 CTX 36 (SUTURE) ×4
SUT PLAIN 2 0 (SUTURE) ×2
SUT PLAIN ABS 2-0 CT1 27XMFL (SUTURE) ×1 IMPLANT
SUT VIC AB 0 CT1 27 (SUTURE) ×2
SUT VIC AB 0 CT1 27XBRD ANBCTR (SUTURE) ×1 IMPLANT
SUT VIC AB 2-0 CT1 27 (SUTURE) ×2
SUT VIC AB 2-0 CT1 TAPERPNT 27 (SUTURE) ×1 IMPLANT
SUT VIC AB 4-0 KS 27 (SUTURE) ×3 IMPLANT
SYR BULB IRRIGATION 50ML (SYRINGE) IMPLANT
TOWEL OR 17X24 6PK STRL BLUE (TOWEL DISPOSABLE) ×3 IMPLANT
TRAY FOLEY BAG SILVER LF 14FR (SET/KITS/TRAYS/PACK) ×3 IMPLANT

## 2016-12-30 NOTE — Brief Op Note (Signed)
12/30/2016  2:58 PM  PATIENT:  [name]  26 y.o. female  PRE-OPERATIVE DIAGNOSIS:  FETAL MACROSOMIA. LGA infant C 39 weeks A2 diabetes  POST-OPERATIVE DIAGNOSIS:  FETAL MACROSOMIA. LGA infant pregnancy at 39 weeks A2 diabetes  PROCEDURE:  Procedure(s): CESAREAN SECTION (N/A)  SURGEON:  Surgeon(s) and Role:    * Tilda Burrow, MD - Primary    * Kathrynn Running, MD - Assisting  PHYSICIAN ASSISTANT:   ASSISTANTS: none   ANESTHESIA:   epidural  EBL:  Total I/O In: 3400 [I.V.:3400] Out: 1100 [Urine:300; Blood:800]  BLOOD ADMINISTERED:none  DRAINS: Foley  LOCAL MEDICATIONS USED:  NONE  SPECIMEN:  No Specimen  DISPOSITION OF SPECIMEN:  N/A  COUNTS:  YES  TOURNIQUET:  * No tourniquets in log *  DICTATION: .Dragon Dictation  PLAN OF CARE: Admit to inpatient   PATIENT DISPOSITION:  PACU - hemodynamically stable.   Delay start of Pharmacological VTE agent (>24hrs) due to surgical blood loss or risk of bleeding: not applicable

## 2016-12-30 NOTE — Anesthesia Procedure Notes (Signed)
Epidural Patient location during procedure: OB Start time: 12/30/2016 12:01 PM End time: 12/30/2016 12:11 PM  Staffing Anesthesiologist: Heather Roberts Performed: anesthesiologist   Preanesthetic Checklist Completed: patient identified, site marked, pre-op evaluation, timeout performed, IV checked, risks and benefits discussed and monitors and equipment checked  Epidural Patient position: sitting Prep: DuraPrep Patient monitoring: heart rate, cardiac monitor, continuous pulse ox and blood pressure Approach: midline Location: L2-L3 Injection technique: LOR saline  Needle:  Needle type: Tuohy  Needle gauge: 17 G Needle length: 9 cm Needle insertion depth: 8 cm Catheter size: 20 Guage Catheter at skin depth: 12 cm Test dose: negative and Other  Assessment Events: blood not aspirated, injection not painful, no injection resistance and negative IV test  Additional Notes Informed consent obtained prior to proceeding including risk of failure, 1% risk of PDPH, risk of minor discomfort and bruising.  Discussed rare but serious complications including epidural abscess, permanent nerve injury, epidural hematoma.  Discussed alternatives to epidural analgesia and patient desires to proceed.  Timeout performed pre-procedure verifying patient name, procedure, and platelet count.  Patient tolerated procedure well.

## 2016-12-30 NOTE — Transfer of Care (Signed)
Immediate Anesthesia Transfer of Care Note  Patient: Cassandra [name]  Procedure(s) Performed: Procedure(s): CESAREAN SECTION (N/A)  Patient Location: PACU  Anesthesia Type:Epidural  Level of Consciousness: awake, alert  and oriented  Airway & Oxygen Therapy: Patient Spontanous Breathing  Post-op Assessment: Report given to RN and Post -op Vital signs reviewed and stable  Post vital signs: Reviewed and stable  Last Vitals:  Vitals:   12/30/16 1032  BP: 130/79  Pulse: 82  Temp: 36.8 C    Last Pain:  Vitals:   12/30/16 1032  TempSrc: Oral         Complications: No apparent anesthesia complications

## 2016-12-30 NOTE — H&P (Signed)
LABOR AND DELIVERY ADMISSION HISTORY AND PHYSICAL NOTE  SIBEL KHURANA is a 26 y.o. female G1P0 with IUP at [redacted]w[redacted]d by 5 wk u/s presenting for scheduled primary cesarean section for suspected macrosomia in the setting of A2/BDM. HC is above 97%ile, and AC is above 99%ile in a woman of short stature with a clinically unfavorable pelvis as assessed by 2 experienced providers. The presenting part is not in the pelvis.  Patient has had a lengthy discussion over our rationale for the recommendation of primary cesarean, and supports that decision  She reports positive fetal movement. She denies leakage of fluid or vaginal bleeding.  Took half of nightly dose of lantus last night, no glyburide this morning.  Prenatal History/Complications:  Past Medical History: Past Medical History:  Diagnosis Date  . Anovulation   . Bipolar 1 disorder (HCC)    no medication  . Contraceptive management 11/22/2013  . Diabetes mellitus without complication (HCC)   . Encounter for Implanon removal 11/22/2013   Could not remove today will try again in 2 weeks, has ben in since 2010 and pt has gained weight  . Hx of migraines   . Infertility, female   . Irregular intermenstrual bleeding 12/13/2014  . Irregular periods 09/26/2014   Spots no true flow in 6 years  . Obesity   . PCO (polycystic ovaries) 10/05/2014    Past Surgical History: Past Surgical History:  Procedure Laterality Date  . WISDOM TOOTH EXTRACTION      Obstetrical History: OB History    Gravida Para Term Preterm AB Living   1             SAB TAB Ectopic Multiple Live Births                  Social History: Social History   Social History  . Marital status: Married    Spouse name: N/A  . Number of children: N/A  . Years of education: N/A   Social History Main Topics  . Smoking status: Former Smoker    Types: Cigars  . Smokeless tobacco: Never Used  . Alcohol use No     Comment: occ  . Drug use: No  . Sexual activity: Yes     Birth control/ protection: None   Other Topics Concern  . None   Social History Narrative  . None    Family History: Family History  Problem Relation Age of Onset  . Cancer Maternal Grandmother     stomach  . Cancer Maternal Grandfather     lung  . COPD Mother   . Emphysema Mother   . Hypertension Mother     Allergies: No Known Allergies  Prescriptions Prior to Admission  Medication Sig Dispense Refill Last Dose  . acetaminophen (TYLENOL) 500 MG tablet Take 1,000 mg by mouth every 6 (six) hours as needed for headache.   Taking  . aspirin EC 81 MG tablet Take 1 tablet (81 mg total) by mouth daily. (Patient not taking: Reported on 12/29/2016) 30 tablet 6 Not Taking  . glyBURIDE (DIABETA) 5 MG tablet Take 1 in am, 1.5 in pm (Patient taking differently: Take 5-7.5 mg by mouth 2 (two) times daily. Take 1 in am, 1.5 in pm) 75 tablet 11 Past Week at Unknown time  . insulin glargine (LANTUS) 100 unit/mL SOPN Inject 0.2 mLs (20 Units total) into the skin at bedtime. 15 mL 11 Taking  . omeprazole (PRILOSEC) 20 MG capsule Take 1 capsule (20 mg total)  by mouth daily. 30 capsule 6 Taking  . Prenatal MV-Min-Fe Fum-FA-DHA (PRENATAL+DHA PO) Take 1 tablet by mouth daily.   Taking  . simethicone (MYLICON) 125 MG chewable tablet Chew 125 mg by mouth daily.   Taking  . BAYER CONTOUR NEXT TEST test strip   2 Taking  . MICROLET LANCETS MISC   0 Taking     Review of Systems   All systems reviewed and negative except as stated in HPI  Blood pressure 130/79, pulse 82, temperature 98.2 F (36.8 C), temperature source Oral, height  (1.575 m), weight 278 lb (126.1 kg), last menstrual period 03/29/2016. General appearance: alert, cooperative and appears stated age Lungs: clear to auscultation bilaterally Heart: regular rate and rhythm Abdomen: soft, non-tender; bowel sounds normal Extremities: No calf swelling or tenderness      Prenatal labs: ABO, Rh: --/--/O POS, O POS (04/16  1211) Antibody: NEG (04/16 1211) Rubella: !Error!imm RPR: Non Reactive (04/16 1211)  HBsAg: Negative (09/28 0914)  HIV: Non Reactive (01/22 1159)  GBS: Negative (04/10 0000)  2 hr Glucola: abnormal Genetic screening:  First wnl Anatomy US: wnl  Prenatal Transfer Tool  Maternal Diabetes: A2/BDM Genetic Screening: Normal Maternal Ultrasounds/Referrals: isolated intracardiac focus Fetal Ultrasounds or other Referrals:  Fetal echo Maternal Substance Abuse:  No Significant Maternal Medications:  Glyburide, aspirin, lantus Significant Maternal Lab Results: Lab values include: Group B Strep negative  Results for orders placed or performed during the hospital encounter of 12/30/16 (from the past 24 hour(s))  Glucose, capillary   Collection Time: 12/30/16 10:58 AM  Result Value Ref Range   Glucose-Capillary 65 65 - 99 mg/dL  Results for orders placed or performed during the hospital encounter of 12/29/16 (from the past 24 hour(s))  Urinalysis, Routine w reflex microscopic   Collection Time: 12/29/16 12:10 PM  Result Value Ref Range   Color, Urine YELLOW YELLOW   APPearance CLEAR CLEAR   Specific Gravity, Urine 1.012 1.005 - 1.030   pH 6.0 5.0 - 8.0   Glucose, UA NEGATIVE NEGATIVE mg/dL   Hgb urine dipstick NEGATIVE NEGATIVE   Bilirubin Urine NEGATIVE NEGATIVE   Ketones, ur NEGATIVE NEGATIVE mg/dL   Protein, ur NEGATIVE NEGATIVE mg/dL   Nitrite NEGATIVE NEGATIVE   Leukocytes, UA NEGATIVE NEGATIVE  CBC   Collection Time: 12/29/16 12:11 PM  Result Value Ref Range   WBC 7.7 4.0 - 10.5 K/uL   RBC 4.74 3.87 - 5.11 MIL/uL   Hemoglobin 14.0 12.0 - 15.0 g/dL   HCT 40.9 81.1 - 91.4 %   MCV 87.6 78.0 - 100.0 fL   MCH 29.5 26.0 - 34.0 pg   MCHC 33.7 30.0 - 36.0 g/dL   RDW 78.2 95.6 - 21.3 %   Platelets 198 150 - 400 K/uL  RPR   Collection Time: 12/29/16 12:11 PM  Result Value Ref Range   RPR Ser Ql Non Reactive Non Reactive  Type and screen   Collection Time: 12/29/16 12:11 PM   Result Value Ref Range   ABO/RH(D) O POS    Antibody Screen NEG    Sample Expiration 01/01/2017   ABO/Rh   Collection Time: 12/29/16 12:11 PM  Result Value Ref Range   ABO/RH(D) O POS     Patient Active Problem List   Diagnosis Date Noted  . Fetal echogenic intracardiac focus on prenatal ultrasound 08/13/2016  . GERD (gastroesophageal reflux disease) 07/23/2016  . Gestational diabetes mellitus, class A2/B 06/14/2016  . Supervision of high risk pregnancy, antepartum 06/11/2016  .  NAUSEA 10/15/2010  . ABDOMINAL PAIN-MULTIPLE SITES 10/15/2010    Assessment: HELAYNA DUN is a 26 y.o. G1P0 at [redacted]w[redacted]d here for primary cesarean for suspected macrosomia.  The risks of cesarean section were discussed with the patient including but were not limited to: bleeding which may require transfusion or reoperation; infection which may require antibiotics; injury to bowel, bladder, ureters or other surrounding organs; injury to the fetus; need for additional procedures including hysterectomy in the event of a life-threatening hemorrhage; placental abnormalities wth subsequent pregnancies, incisional problems, thromboembolic phenomenon and other postoperative/anesthesia complications.   #Labor:none  #A2/BDM: initial glucose here 67, asymptomatic. Likely resume metformin PP. #ID:  gbs neg, plan for 3 g cefazolin prior to c/s #MOF: breast #MOC:likely lng-iud #Circ:  Yes, inpatient  Silvano Bilis 12/30/2016, 12:06 PM  Tilda Burrow, MD

## 2016-12-30 NOTE — Op Note (Signed)
12/30/2016  2:58 PM  PATIENT:  Cassandra [name]  26 y.o. female  PRE-OPERATIVE DIAGNOSIS:  FETAL MACROSOMIA. LGA infant C 39 weeks A2 diabetes  POST-OPERATIVE DIAGNOSIS:  FETAL MACROSOMIA. LGA infant pregnancy at 39 weeks A2 diabetes  PROCEDURE:  Procedure(s): CESAREAN SECTION (N/A)  SURGEON:  Surgeon(s) and Role:    * Tilda Burrow, MD - Primary    * Kathrynn Running, MD - Assisting  PHYSICIAN ASSISTANT:   ASSISTANTS: none   ANESTHESIA:   epidural  EBL:  Total I/O In: 3400 [I.V.:3400] Out: 1100 [Urine:300; Blood:800]  BLOOD ADMINISTERED:none  DRAINS: Foley  LOCAL MEDICATIONS USED:  NONE  SPECIMEN:  No Specimen  DISPOSITION OF SPECIMEN:  N/A  COUNTS:  YES  TOURNIQUET:  * No tourniquets in log *  DICTATION: .Dragon Dictation  PLAN OF CARE: Admit to inpatient   PATIENT DISPOSITION:  PACU - hemodynamically stable.   Delay start of Pharmacological VTE agent (>24hrs) due to surgical blood loss or risk of bleeding: not applicable Indications: 26 year old A2 diabetic with LGA infant lastultrasoundbiparietaldiameterandabdominalcircumference,withbeforemealsgreaterthan99thpercentile. Details of procedure Patient was taken to the operating room prepped and draped for lower abdominal surgery, after epidural effective and timeout conducted by surgical team. Answers lower abdominal incision was made 1 cm above the pannus crease. A Traxi was in place. Opened transversely, abdominal cavity entered in the midline and Alexis wound retractor position and transverse uterine incision performed extended laterally using index finger traction then cephalad and caudad and the infant delivered through the incision with fundal pressure. The baby showed wet and then inactive and required prompt cord clamping and transfer the pediatricians. See their notes for details regarding the small bit of resuscitation necessary. The baby then perked up. Delivered easily intact three-vessel cord  placenta. Uterus was irrigated, closed in a 2 layer fashion running locking first layer with continuous running second layer. A figure-of-eight suture was required on the right side next to the uterine vessels due to the close proximity of the incision to the uterine vessels. The hemostasis good abdomen was irrigated. Anterior peritoneum closed with 2-0 chromic, closed with 0 Vicryl subcutaneous tissues with interrupted 20 plain in subcuticular 4-0 Vicryl close the skin incision with suction vacuum placed over the incision and estimated blood loss 800 cc 31M cc urine and 3300 cc of fluid administered condition to recovery room good

## 2016-12-30 NOTE — Lactation Note (Signed)
This note was copied from a baby's chart. Lactation Consultation Note  Patient Name: Cassandra [name] [id number] Date: 12/30/2016 Reason for consult: Initial assessment  Initial visit at 5 hours of age.  Baby is [redacted]w[redacted]d at 8#12oz.  Mom has PCOS with irregular cycles and insulin dependent.  Baby was spoon fed in recovery with stable glucose level of 46. Mom reports recent feeding of 15 minutes with baby remaining STS.  Mom reports leaking and noted darkened areolas.  Mom denies breast enlargement during pregnancy.  Mom has large breasts and compressible tissue. [facility] LC resources given and discussed.  Encouraged to feed with early cues on demand.  Early newborn behavior discussed.  Hand expression demonstrated by LC with colostrum visible.   LC encouraged mom to continue to work on hand expression to increase her supply and offer to baby by spoon as needed.  Mom to call for assist as needed.     Maternal Data Has patient been taught Hand Expression?: Yes Does the patient have breastfeeding experience prior to this delivery?: No  Feeding Feeding Type: Breast Fed Length of feed: 15 min  LATCH Score/Interventions Latch: Repeated attempts needed to sustain latch, nipple held in mouth throughout feeding, stimulation needed to elicit sucking reflex. Intervention(s): Adjust position;Assist with latch;Breast massage;Breast compression  Audible Swallowing: A few with stimulation Intervention(s): Skin to skin;Hand expression;Alternate breast massage  Type of Nipple: Everted at rest and after stimulation (short shaft )  Comfort (Breast/Nipple): Soft / non-tender     Hold (Positioning): Assistance needed to correctly position infant at breast and maintain latch. Intervention(s): Breastfeeding basics reviewed;Position options;Support Pillows;Skin to skin  LATCH Score: 7  Lactation Tools Discussed/Used WIC Program: Yes   Consult Status Consult Status: Follow-up Date: 12/31/16 Follow-up  type: In-patient    [name], [name] 12/30/2016, 7:31 PM

## 2016-12-30 NOTE — Anesthesia Preprocedure Evaluation (Addendum)
Anesthesia Evaluation  Patient identified by MRN, date of birth, ID band Patient awake    Reviewed: Allergy & Precautions, NPO status , Patient's Chart, lab work & pertinent test results  Airway Mallampati: III  TM Distance: >3 FB     Dental no notable dental hx. (+) Dental Advisory Given   Pulmonary former smoker,    Pulmonary exam normal        Cardiovascular negative cardio ROS Normal cardiovascular exam     Neuro/Psych  Headaches, PSYCHIATRIC DISORDERS Bipolar Disorder    GI/Hepatic Neg liver ROS, GERD  ,  Endo/Other  diabetesMorbid obesity  Renal/GU negative Renal ROS     Musculoskeletal negative musculoskeletal ROS (+)   Abdominal   Peds  Hematology negative hematology ROS (+)   Anesthesia Other Findings Day of surgery medications reviewed with the patient.  Reproductive/Obstetrics                            Anesthesia Physical Anesthesia Plan  ASA: III  Anesthesia Plan: MAC and Epidural   Post-op Pain Management:    Induction:   Airway Management Planned: Natural Airway and Simple Face Mask  Additional Equipment:   Intra-op Plan:   Post-operative Plan:   Informed Consent: I have reviewed the patients History and Physical, chart, labs and discussed the procedure including the risks, benefits and alternatives for the proposed anesthesia with the patient or authorized representative who has indicated his/her understanding and acceptance.   Dental advisory given  Plan Discussed with: CRNA, Anesthesiologist and Surgeon  Anesthesia Plan Comments:        Anesthesia Quick Evaluation

## 2016-12-30 NOTE — Anesthesia Postprocedure Evaluation (Signed)
Anesthesia Post Note  Patient: Cassandra [name]  Procedure(s) Performed: Procedure(s) (LRB): CESAREAN SECTION (N/A)  Patient location during evaluation: PACU Anesthesia Type: MAC and Epidural Level of consciousness: awake and alert Pain management: pain level controlled Vital Signs Assessment: post-procedure vital signs reviewed and stable Respiratory status: spontaneous breathing and respiratory function stable Cardiovascular status: blood pressure returned to baseline and stable Postop Assessment: spinal receding Anesthetic complications: no        Last Vitals:  Vitals:   12/30/16 1642 12/30/16 1643  BP:    Pulse: 67 67  Resp: 12 (!) 9  Temp:      Last Pain:  Vitals:   12/30/16 1557  TempSrc:   PainSc: 6    Pain Goal:                 Ofilia Rayon DANIEL

## 2016-12-31 ENCOUNTER — Encounter (HOSPITAL_COMMUNITY): Payer: Self-pay | Admitting: *Deleted

## 2016-12-31 LAB — CBC
HCT: 36.1 % (ref 36.0–46.0)
Hemoglobin: 12.1 g/dL (ref 12.0–15.0)
MCH: 29.3 pg (ref 26.0–34.0)
MCHC: 33.5 g/dL (ref 30.0–36.0)
MCV: 87.4 fL (ref 78.0–100.0)
PLATELETS: 167 10*3/uL (ref 150–400)
RBC: 4.13 MIL/uL (ref 3.87–5.11)
RDW: 14.6 % (ref 11.5–15.5)
WBC: 11.4 10*3/uL — ABNORMAL HIGH (ref 4.0–10.5)

## 2016-12-31 LAB — BIRTH TISSUE RECOVERY COLLECTION (PLACENTA DONATION)

## 2016-12-31 LAB — GLUCOSE, RANDOM: GLUCOSE: 135 mg/dL — AB (ref 65–99)

## 2016-12-31 NOTE — Progress Notes (Signed)
POSTOPERATIVE DAY # 1 S/P pLTCS   S:         Reports feeling well             Tolerating po intake / no nausea / no vomiting / positive flatus / no BM             Bleeding is light             Pain controlled withibuprofen (OTC)             Up ad lib / ambulatory/ voiding QS  Newborn breast feeding  / Circumcision planned inpatient    O:  VS: BP (!) 94/42 (BP Location: Right Arm) Comment: no c/o dizziness or light headedness  Pulse 77   Temp 98.3 F (36.8 C) (Oral)   Resp 16   Ht  (1.575 m)   Wt 126.1 kg (278 lb)   LMP 03/29/2016 (Exact Date)   SpO2 97%   BMI 50.85 kg/m    LABS:               Recent Labs  12/29/16 1211 12/31/16 0508  WBC 7.7 11.4*  HGB 14.0 12.1  PLT 198 167               Bloodtype: --/--/O POS, O POS (04/16 1211)  Rubella: 2.09 (09/28 0914)                     EBL:                          I&O: Intake/Output      04/17 0701 - 04/18 0700   P.O. 120   I.V. (mL/kg) 3525 (28)   Total Intake(mL/kg) 3645 (28.9)   Urine (mL/kg/hr) 300   Blood 800   Total Output 1100   Net +2545                    Physical Exam:             Alert and Oriented X3  Lungs: Clear and unlabored  Heart: regular rate and rhythm / no mumurs  Abdomen: soft, non-tender, non-distended              Fundus: firm, non-tender, U             Dressing PICO dressing shows old blood; otherwise clean and dry with no leaking or pus. UTA wound due to dressing           Extremities: no edema, no calf pain or tenderness,     Reason for C/S? Suspected macrosomia and CPD  A:        POD # 1 S/P PLTCS            Doing well  P:        Routine postoperative care                [facility]  Special instructions/meds Consider metformin   Anticipate discharge on 01/01/2017    Charlesetta Garibaldi Kooistra CNM, MSN,12/31/2016, 6:39 AM

## 2016-12-31 NOTE — Addendum Note (Signed)
Addendum  created 12/31/16 0854 by Earmon Phoenix, CRNA   Sign clinical note

## 2016-12-31 NOTE — Lactation Note (Signed)
This note was copied from a baby's chart. Lactation Consultation Note  Patient Name: Boy Akera Snowberger [id number] Date: 12/31/2016 Reason for consult: Follow-up assessment  Baby 24 hours old. Mom reports that baby has been latching with NS, but is sleepy at the breast. Undressed baby and assisted mom to latch baby to right breast in football position--without the NS--and baby latched deeply and suckled rhythmically with a few swallows noted. Reviewed positioning and how to support baby's head and mom's breast while latching. Enc mom to keep baby STS and latch with cues. Enc mom to begin pumping if baby not continuing to latch and nurse well. Discussed assessment and interventions with patients bedside nurse, Sherron Ales, Charity fundraiser.   Maternal Data    Feeding Feeding Type: Breast Fed Length of feed:  (LC assessed the first 10 minutes of BF. )  LATCH Score/Interventions Latch: Grasps breast easily, tongue down, lips flanged, rhythmical sucking. Intervention(s): Adjust position;Assist with latch;Breast compression  Audible Swallowing: A few with stimulation Intervention(s): Skin to skin;Hand expression  Type of Nipple: Everted at rest and after stimulation  Comfort (Breast/Nipple): Soft / non-tender     Hold (Positioning): Assistance needed to correctly position infant at breast and maintain latch. Intervention(s): Breastfeeding basics reviewed;Support Pillows;Skin to skin;Position options  LATCH Score: 8  Lactation Tools Discussed/Used     Consult Status Consult Status: Follow-up Date: 01/01/17 Follow-up type: In-patient    Sherlyn Hay 12/31/2016, 2:56 PM

## 2016-12-31 NOTE — Anesthesia Postprocedure Evaluation (Addendum)
Anesthesia Post Note  Patient: Cassandra [name]  Procedure(s) Performed: Procedure(s) (LRB): CESAREAN SECTION (N/A)  Patient location during evaluation: Mother Baby Anesthesia Type: MAC Level of consciousness: awake Pain management: pain level controlled Vital Signs Assessment: post-procedure vital signs reviewed and stable Respiratory status: spontaneous breathing Cardiovascular status: stable Postop Assessment: no headache, no backache and epidural receding Anesthetic complications: no        Last Vitals:  Vitals:   12/31/16 0426 12/31/16 0800  BP: (!) 94/42 (!) 104/48  Pulse: 77 74  Resp: 16 16  Temp:  37.2 C    Last Pain:  Vitals:   12/31/16 0800  TempSrc: Oral  PainSc: 0-No pain   Pain Goal:                 Everette Rank

## 2017-01-01 ENCOUNTER — Encounter (HOSPITAL_COMMUNITY): Payer: Self-pay

## 2017-01-01 LAB — HEPATITIS C ANTIBODY

## 2017-01-01 LAB — GLUCOSE, RANDOM: Glucose, Bld: 97 mg/dL (ref 65–99)

## 2017-01-01 MED ORDER — IBUPROFEN 600 MG PO TABS: 600.0000 mg | ORAL_TABLET | Freq: Four times a day (QID) | ORAL | 0 refills | Status: DC | PRN

## 2017-01-01 MED ORDER — OXYCODONE HCL 5 MG PO TABS: 5.0000 mg | ORAL_TABLET | ORAL | 0 refills | Status: DC | PRN

## 2017-01-01 NOTE — Clinical Social Work Maternal (Signed)
  CLINICAL SOCIAL WORK MATERNAL/CHILD NOTE  Patient Details  Name: Cassandra [name] MRN: [id number] Date of Birth: 08/27/1991  Date:  01/01/2017  Clinical Social Worker Initiating Note:  Cassandra [name] Date/ Time Initiated:  12/31/16/1200     Child's Name:  Cassandra [name]   Legal Guardian:  Mother (FOB is Cassandra [name] )   Need for Interpreter:  None   Date of Referral:  12/30/16     Reason for Referral:  Behavioral Health Issues, including SI  (MOB has a dx of bipolar)   Referral Source:  Central Nursery   Address:  [address] [place] [address]  Phone number:  [phone]   Household Members:  Self, Spouse   Natural Supports (not living in the home):  Immediate Family, Extended Family, Friends (FOB's family will be a source of support. )   Professional Supports: None   Employment: Unemployed   Type of Work:     Education:  High school graduate   Financial Resources:  Private Insurance   Other Resources:  WIC   Cultural/Religious Considerations Which May Impact Care:  Per MOB's chart, MOB is Christian.  Strengths:  Ability to meet basic needs , Pediatrician chosen , Understanding of illness, Home prepared for child    Risk Factors/Current Problems:  Mental Health Concerns    Cognitive State:  Alert , Able to Concentrate , Linear Thinking , Insightful , Goal Oriented    Mood/Affect:  Happy , Bright , Interested , Comfortable , Relaxed    CSW Assessment: CSW met with MOB to complete an assessment for hx of bipolar disorder.  When CSW arrived, MOB was resting in bed engaging in skin to skin with infant. MOB's mom and husband were also present.  MOB provided CSW permission to complete clinical assessment while her guest were present. CSW inquired about MOB's mental health hx and MOB acknowledged a hx of bipolar disorder since age 26.  MOB reports that MOB is not currently on a medication regiment but receives consistent outpatient  counseling at [facility], with Cassandra [name].  CSW praised MOB for being proactive and being consistent with outpatient counseling. CSW educated MOB about PPD. CSW informed MOB of possible supports and interventions to decrease PPD.  CSW also encouraged MOB to seek medical attention if needed for increased signs and symptoms for PPD. CSW provided MOB with a PPD checklist an encouraged MOB to utilize it weekly, MOB agreed. CSW also provided MOB with hospital's support group flyer and encouraged MOB to attend if her schedule permits. CSW thanked MOB for meeting with CSW and provided MOB with CSW contact information.    CSW Plan/Description:  Information/Referral to Community Resources , Patient/Family Education , No Further Intervention Required/No Barriers to Discharge   Cassandra [name], MSW, LCSW Clinical Social Work [phone]  Cassandra Gros D BOYD-GILYARD, LCSW 01/01/2017, 9:12 AM  

## 2017-01-01 NOTE — Lactation Note (Signed)
This note was copied from a baby's chart. Lactation Consultation Note: Mother has been using a #20 nipple shield. She reports that she has seen colostrum in the shield. Mother reports that infant just finished a 10 min feeding without the shield. Offered to assist mother with latching infant. Infant placed in football hold. Mother taught to firm nipple and hand express before latching infant. Infant latched well and observed with good burst of suckling and audible swallow without the shield.  Infant sustained latch for 10 mins with strong tugging. Mother verbalize pinching with latch. When infant released the breast a slight pinch on the tip of nipple noted. Reviewed use of the nipple shield with mother. Advised to breastfeed on the bare breast and only use nipple shield when needed. Mother advised in treatment to prevent severe engorgement. Mother was given a harmony hand pump with instructions to use . Advised mother in S/S of Mastitis. Suggested that if mother continues to use the nipple shield to follow up with lactation services for a consult. Mother will call for outpatient visit.  Mother is aware of available LC services and to call as needed.   Patient Name: Cassandra [name] HQION'G Date: 01/01/2017 Reason for consult: Follow-up assessment   Maternal Data    Feeding Feeding Type: Breast Fed Length of feed: 10 min  LATCH Score/Interventions Latch: Grasps breast easily, tongue down, lips flanged, rhythmical sucking. Intervention(s): Skin to skin;Teach feeding cues;Waking techniques Intervention(s): Adjust position;Assist with latch;Breast compression  Audible Swallowing: Spontaneous and intermittent Intervention(s): Hand expression;Alternate breast massage  Type of Nipple: Everted at rest and after stimulation  Comfort (Breast/Nipple): Filling, red/small blisters or bruises, mild/mod discomfort  Problem noted: Mild/Moderate discomfort Interventions (Mild/moderate discomfort): Hand  expression  Hold (Positioning): Assistance needed to correctly position infant at breast and maintain latch. Intervention(s): Support Pillows;Position options  LATCH Score: 8  Lactation Tools Discussed/Used     Consult Status Consult Status: Complete    Michel Bickers 01/01/2017, 11:28 AM

## 2017-01-01 NOTE — Discharge Summary (Signed)
OB Discharge Summary     Patient Name: Cassandra [name] DOB: 10-09-1990 MRN: [id number]  Date of admission: 12/30/2016 Delivering MD: Tilda Burrow   Date of discharge: 01/01/2017  Admitting diagnosis: FETAL MACRISOMIA Intrauterine pregnancy: [redacted]w[redacted]d     Secondary diagnosis:  Active Problems:   Gestational diabetes mellitus, class A2/B   LGA (large for gestational age) infant   Status post primary low transverse cesarean section  Additional problems: none     Discharge diagnosis: Term Pregnancy Delivered and GDM A2                                                                                                Post partum procedures:none  Augmentation: N/A  Complications: None  Hospital course:  Sceduled C/S   26 y.o. yo G1P0 at [redacted]w[redacted]d was admitted to the hospital 12/30/2016 for scheduled cesarean section with the following indication:Macrosomia and Elective Primary.  Membrane Rupture Time/Date: 1:59 PM ,12/30/2016   Patient delivered a Viable infant.12/30/2016  Details of operation can be found in separate operative note.  Pateint had an uncomplicated postpartum course.  She is ambulating, tolerating a regular diet, passing flatus, and urinating well. Patient is discharged home in stable condition on  01/01/17         Physical exam  Vitals:   12/31/16 0426 12/31/16 0800 12/31/16 1700 01/01/17 0500  BP: (!) 94/42 (!) 104/48 120/83 116/74  Pulse: 77 74 83 76  Resp: Temp:  98.9 F (37.2 C) 97.9 F (36.6 C) 98.3 F (36.8 C)  TempSrc:  Oral Oral Oral  SpO2: 97% 95% 97%   Weight:      Height:       General: alert and cooperative Lochia: appropriate Uterine Fundus: firm Incision: PICO dsg in place, stained and marked DVT Evaluation: No evidence of DVT seen on physical exam. Labs: Lab Results  Component Value Date   WBC 11.4 (H) 12/31/2016   HGB 12.1 12/31/2016   HCT 36.1 12/31/2016   MCV 87.4 12/31/2016   PLT 167 12/31/2016   CMP Latest Ref Rng &  Units 01/01/2017  Glucose 65 - 99 mg/dL 97  BUN 6 - 23 mg/dL -  Creatinine 5.62 - 1.30 mg/dL -  Sodium 865 - 784 mEq/L -  Potassium 3.5 - 5.3 mEq/L -  Chloride 96 - 112 mEq/L -  CO2 19 - 32 mEq/L -  Calcium 8.4 - 10.5 mg/dL -  Total Protein 6.0 - 8.3 g/dL -  Total Bilirubin 0.2 - 1.2 mg/dL -  Alkaline Phos 39 - 696 U/L -  AST 0 - 37 U/L -  ALT 0 - 35 U/L -    Discharge instruction: per After Visit Summary and "Baby and Me Booklet".  After visit meds:  Allergies as of 01/01/2017   No Known Allergies     Medication List    STOP taking these medications   acetaminophen 500 MG tablet Commonly known as:  TYLENOL   aspirin EC 81 MG tablet   BAYER CONTOUR NEXT TEST test strip Generic drug:  glucose blood  glyBURIDE 5 MG tablet Commonly known as:  DIABETA   insulin glargine 100 unit/mL Sopn Commonly known as:  LANTUS   MICROLET LANCETS Misc   omeprazole 20 MG capsule Commonly known as:  PRILOSEC   simethicone 125 MG chewable tablet Commonly known as:  MYLICON     TAKE these medications   ibuprofen 600 MG tablet Commonly known as:  ADVIL,MOTRIN Take 1 tablet (600 mg total) by mouth every 6 (six) hours as needed.   oxyCODONE 5 MG immediate release tablet Commonly known as:  Oxy IR/ROXICODONE Take 1 tablet (5 mg total) by mouth every 4 (four) hours as needed (pain scale 4-7).   PRENATAL+DHA PO Take 1 tablet by mouth daily.       Diet: carb modified diet  Activity: Advance as tolerated. Pelvic rest for 6 weeks.   Outpatient follow up:1 week Follow up Appt:Future Appointments Date Time Provider Department Center  01/06/2017 2:30 PM Lazaro Arms, MD [facility]   Follow up Visit:No Follow-up on file.  Postpartum contraception: IUD Mirena vs POPs  Newborn Data: Live born female  Birth Weight: 8 lb 12.2 oz (3975 g) APGAR: 5, 7  Baby Feeding: Breast Disposition:home with mother   01/01/2017 Cam Hai, CNM  7:40 AM

## 2017-01-06 ENCOUNTER — Ambulatory Visit (INDEPENDENT_AMBULATORY_CARE_PROVIDER_SITE_OTHER): Payer: BLUE CROSS/BLUE SHIELD | Admitting: Obstetrics & Gynecology

## 2017-01-06 ENCOUNTER — Encounter: Payer: Self-pay | Admitting: Obstetrics & Gynecology

## 2017-01-06 VITALS — BP 126/82 | HR 74 | Wt 261.0 lb

## 2017-01-06 DIAGNOSIS — Z98891 History of uterine scar from previous surgery: Secondary | ICD-10-CM

## 2017-01-06 NOTE — Progress Notes (Signed)
  HPI: Patient returns for routine postoperative follow-up having undergone Caesarean section on 12/30/2016.  The patient's immediate postoperative recovery has been unremarkable. Since hospital discharge the patient reports no problems.   Current Outpatient Prescriptions: ibuprofen (ADVIL,MOTRIN) 600 MG tablet, Take 1 tablet (600 mg total) by mouth every 6 (six) hours as needed., Disp: 30 tablet, Rfl: 0 oxyCODONE (OXY IR/ROXICODONE) 5 MG immediate release tablet, Take 1 tablet (5 mg total) by mouth every 4 (four) hours as needed (pain scale 4-7)., Disp: 50 tablet, Rfl: 0 Prenatal MV-Min-Fe Fum-FA-DHA (PRENATAL+DHA PO), Take 1 tablet by mouth daily., Disp: , Rfl:   No current facility-administered medications for this visit.     Blood pressure 126/82, pulse 74, weight 261 lb (118.4 kg), currently breastfeeding.  Physical Exam: Incision clean dry some yeast painted with gentian violet  Diagnostic Tests: none  Pathology: n/a  Impression: S/p c section 1 week  Plan:   Follow up: 4  weeks postpartum visit  Lazaro Arms, MD

## 2017-02-05 ENCOUNTER — Encounter: Payer: Self-pay | Admitting: Women's Health

## 2017-02-05 ENCOUNTER — Ambulatory Visit (INDEPENDENT_AMBULATORY_CARE_PROVIDER_SITE_OTHER): Payer: BLUE CROSS/BLUE SHIELD | Admitting: Women's Health

## 2017-02-05 DIAGNOSIS — Z3202 Encounter for pregnancy test, result negative: Secondary | ICD-10-CM | POA: Diagnosis not present

## 2017-02-05 DIAGNOSIS — Z98891 History of uterine scar from previous surgery: Secondary | ICD-10-CM

## 2017-02-05 DIAGNOSIS — Z8632 Personal history of gestational diabetes: Secondary | ICD-10-CM

## 2017-02-05 LAB — POCT URINE PREGNANCY: PREG TEST UR: NEGATIVE

## 2017-02-05 MED ORDER — NORETHINDRONE 0.35 MG PO TABS: ORAL_TABLET | ORAL | 11 refills | Status: DC

## 2017-02-05 NOTE — Progress Notes (Signed)
Subjective:    Cassandra [name] is a 26 y.o. G1P0 Caucasian female who presents for a postpartum visit. She is 5 weeks postpartum following a primary cesarean section, low transverse incision at 39.3 gestational weeks d/t suspected macrosomia in setting of A2/BDM. Anesthesia: spinal. I have fully reviewed the prenatal and intrapartum course. Baby weighed 8lb 12.2oz (3975g). Was taking metformin prior to pregnancy for pre-diabetic range A1C and PCOS. Not taking metformin right now. Postpartum course has been uncomplicated. Baby's course has been uncomplicated. Baby is feeding by pumped breast milk- had lip & tongue tie that were corrected, but still had problems latching. Bleeding no bleeding. Bowel function is normal. Bladder function is normal. Patient is sexually active. Last sexual activity: last night. Contraception method is condoms and wants pills.  Postpartum depression screening: negative. Score 4.  Last pap 12/25/14 and was neg.  The following portions of the patient's history were reviewed and updated as appropriate: allergies, current medications, past medical history, past surgical history and problem list.  Review of Systems Pertinent items are noted in HPI.   Vitals:   02/05/17 1151  BP: 108/70  Weight: 251 lb (113.9 kg)  Height: 5\' 2"  (1.575 m)   No LMP recorded.  Objective:   General:  alert, cooperative and no distress   Breasts:  deferred, no complaints  Lungs: clear to auscultation bilaterally  Heart:  regular rate and rhythm  Abdomen: soft, nontender, incision well healed   Vulva: normal  Vagina: normal vagina  Cervix:  closed  Corpus: Well-involuted  Adnexa:  Non-palpable  Rectal Exam: No hemorrhoids        Assessment:   Postpartum exam 5 wks s/p PLTCS d/t suspected fetal macrosomia w/ A2/BDM A2/BDM during pregnancy H/O pre-diabetes and PCOS prior to pregnancy Breastfeeding Depression screening Contraception counseling   Plan:  Contraception: rx  micronor w/ 11RF, understands has to take at exact same time daily to be effective Follow up in: 3 weeks for postpartum 2hr gtt (no visit), then 6020yr for pap & physical, or earlier if needed  Cassandra [name], Cassandra [name] CNM, WHNP-BC 02/05/2017 12:01 PM

## 2017-02-05 NOTE — Patient Instructions (Addendum)
You will have your sugar test next visit.  Please do not eat or drink anything after midnight the night before you come, not even water.  You will be here for at least two hours.     Tips To Increase Milk Supply  Lots of water! Enough so that your urine is clear  Plenty of calories, if you're not getting enough calories, your milk supply can decrease  Breastfeed/pump often, every 2-3 hours x 20-2330mins  Fenugreek 3 pills 3 times a day, this may make your urine smell like maple syrup  Mother's Milk Tea  Lactation cookies, google for the recipe  Real oatmeal   Norethindrone tablets (contraception) What is this medicine? NORETHINDRONE (nor eth IN drone) is an oral contraceptive. The product contains a female hormone known as a progestin. It is used to prevent pregnancy. This medicine may be used for other purposes; ask your health care provider or pharmacist if you have questions. COMMON BRAND NAME(S): Camila, Deblitane 28-Day, Errin, Heather, [place]Jencycla, Jolivette, [place] FallsLyza, Nor-QD, Nora-BE, Norlyroc, Ortho Micronor, Hewlett-PackardSharobel 28-Day What should I tell my health care provider before I take this medicine? They need to know if you have any of these conditions: -blood vessel disease or blood clots -breast, cervical, or vaginal cancer -diabetes -heart disease -kidney disease -liver disease -mental depression -migraine -seizures -stroke -vaginal bleeding -an unusual or allergic reaction to norethindrone, other medicines, foods, dyes, or preservatives -pregnant or trying to get pregnant -breast-feeding How should I use this medicine? Take this medicine by mouth with a glass of water. You may take it with or without food. Follow the directions on the prescription label. Take this medicine at the same time each day and in the order directed on the package. Do not take your medicine more often than directed. Contact your pediatrician regarding the use of this medicine in children. Special care  may be needed. This medicine has been used in female children who have started having menstrual periods. A patient package insert for the product will be given with each prescription and refill. Read this sheet carefully each time. The sheet may change frequently. Overdosage: If you think you have taken too much of this medicine contact a poison control center or emergency room at once. NOTE: This medicine is only for you. Do not share this medicine with others. What if I miss a dose? Try not to miss a dose. Every time you miss a dose or take a dose late your chance of pregnancy increases. When 1 pill is missed (even if only 3 hours late), take the missed pill as soon as possible and continue taking a pill each day at the regular time (use a back up method of birth control for the next 48 hours). If more than 1 dose is missed, use an additional birth control method for the rest of your pill pack until menses occurs. Contact your health care professional if more than 1 dose has been missed. What may interact with this medicine? Do not take this medicine with any of the following medications: -amprenavir or fosamprenavir -bosentan This medicine may also interact with the following medications: -antibiotics or medicines for infections, especially rifampin, rifabutin, rifapentine, and griseofulvin, and possibly penicillins or tetracyclines -aprepitant -barbiturate medicines, such as phenobarbital -carbamazepine -felbamate -modafinil -oxcarbazepine -phenytoin -ritonavir or other medicines for HIV infection or AIDS -St. John's wort -topiramate This list may not describe all possible interactions. Give your health care provider a list of all the medicines, herbs, non-prescription drugs, or  dietary supplements you use. Also tell them if you smoke, drink alcohol, or use illegal drugs. Some items may interact with your medicine. What should I watch for while using this medicine? Visit your doctor or  health care professional for regular checks on your progress. You will need a regular breast and pelvic exam and Pap smear while on this medicine. Use an additional method of birth control during the first cycle that you take these tablets. If you have any reason to think you are pregnant, stop taking this medicine right away and contact your doctor or health care professional. If you are taking this medicine for hormone related problems, it may take several cycles of use to see improvement in your condition. This medicine does not protect you against HIV infection (AIDS) or any other sexually transmitted diseases. What side effects may I notice from receiving this medicine? Side effects that you should report to your doctor or health care professional as soon as possible: -breast tenderness or discharge -pain in the abdomen, chest, groin or leg -severe headache -skin rash, itching, or hives -sudden shortness of breath -unusually weak or tired -vision or speech problems -yellowing of skin or eyes Side effects that usually do not require medical attention (report to your doctor or health care professional if they continue or are bothersome): -changes in sexual desire -change in menstrual flow -facial hair growth -fluid retention and swelling -headache -irritability -nausea -weight gain or loss This list may not describe all possible side effects. Call your doctor for medical advice about side effects. You may report side effects to FDA at 1-800-FDA-1088. Where should I keep my medicine? Keep out of the reach of children. Store at room temperature between 15 and 30 degrees C (59 and 86 degrees F). Throw away any unused medicine after the expiration date. NOTE: This sheet is a summary. It may not cover all possible information. If you have questions about this medicine, talk to your doctor, pharmacist, or health care provider.  2018 Elsevier/Gold Standard (2012-05-21 16:41:35)

## 2017-02-14 ENCOUNTER — Encounter (HOSPITAL_COMMUNITY): Payer: Self-pay | Admitting: Obstetrics and Gynecology

## 2017-02-14 NOTE — Addendum Note (Signed)
Addendum  created 02/14/17 0822 by Marielys Trinidad, MD   Sign clinical note    

## 2017-02-26 ENCOUNTER — Other Ambulatory Visit: Payer: BLUE CROSS/BLUE SHIELD

## 2017-11-19 ENCOUNTER — Ambulatory Visit: Payer: BLUE CROSS/BLUE SHIELD | Admitting: Women's Health

## 2018-01-11 DIAGNOSIS — F411 Generalized anxiety disorder: Secondary | ICD-10-CM | POA: Diagnosis not present

## 2018-01-28 DIAGNOSIS — F411 Generalized anxiety disorder: Secondary | ICD-10-CM | POA: Diagnosis not present

## 2018-02-20 DIAGNOSIS — F411 Generalized anxiety disorder: Secondary | ICD-10-CM | POA: Diagnosis not present

## 2018-03-25 DIAGNOSIS — F411 Generalized anxiety disorder: Secondary | ICD-10-CM | POA: Diagnosis not present

## 2018-04-22 DIAGNOSIS — F411 Generalized anxiety disorder: Secondary | ICD-10-CM | POA: Diagnosis not present

## 2018-06-03 DIAGNOSIS — F411 Generalized anxiety disorder: Secondary | ICD-10-CM | POA: Diagnosis not present

## 2018-07-26 DIAGNOSIS — F411 Generalized anxiety disorder: Secondary | ICD-10-CM | POA: Diagnosis not present

## 2018-09-03 ENCOUNTER — Ambulatory Visit (INDEPENDENT_AMBULATORY_CARE_PROVIDER_SITE_OTHER): Payer: BLUE CROSS/BLUE SHIELD | Admitting: Obstetrics & Gynecology

## 2018-09-03 ENCOUNTER — Encounter: Payer: Self-pay | Admitting: Obstetrics & Gynecology

## 2018-09-03 VITALS — BP 136/87 | HR 87 | Ht 62.0 in | Wt 266.0 lb

## 2018-09-03 DIAGNOSIS — B9689 Other specified bacterial agents as the cause of diseases classified elsewhere: Secondary | ICD-10-CM

## 2018-09-03 DIAGNOSIS — N760 Acute vaginitis: Secondary | ICD-10-CM

## 2018-09-03 MED ORDER — METRONIDAZOLE 0.75 % VA GEL: VAGINAL | 0 refills | Status: DC

## 2018-09-03 NOTE — Addendum Note (Signed)
Addended by: Lazaro ArmsEURE, LUTHER H on: 09/03/2018 01:05 PM   Modules accepted: Orders

## 2018-09-03 NOTE — Progress Notes (Signed)
       Chief Complaint  Patient presents with  . Vaginal Discharge    x 2 weeks    Blood pressure 136/87, pulse 87, height 5\' 2"  (1.575 m), weight 266 lb (120.7 kg), last menstrual period 07/08/2018, not currently breastfeeding.  27 y.o. G1P0 Patient's last menstrual period was 07/08/2018. The current method of family planning is nothing.  Subjective Vaginal discharge for 2weeks Itching no Irritation no Odor yes Similar to previous no  Previous treatment n/a  Objective Vulva:  normal appearing vulva with no masses, tenderness or lesions Vagina:  normal mucosa, thin grey discharge Cervix:  no cervical motion tenderness and no lesions Uterus:  normal size, contour, position, consistency, mobility, non-tender Adnexa: ovaries:present,       Pertinent ROS No burning with urination, frequency or urgency No nausea, vomiting or diarrhea Nor fever chills or other constitutional symptoms   Labs or studies Wet Prep:   A sample of vaginal discharge was obtained from the posterior fornix using a cotton swab. 2 drops of saline were placed on a slide and the cotton swab was immersed in the saline. Microscopic evaluation was performed and results were as follows:  Negative  for yeast  Positive for clue cells , consistent with Bacterial vaginosis Negative for trichomonas  Normal WBC population   Whiff test: Positive     Impression Diagnoses this Encounter::   ICD-10-CM   1. BV (bacterial vaginosis) N76.0    B96.89     Established relevant diagnosis(es):   Plan/Recommendations: Meds ordered this encounter  Medications  . metroNIDAZOLE (METROGEL VAGINAL) 0.75 % vaginal gel    Sig: Nightly x 5 nights    Dispense:  70 g    Refill:  0    Labs or Scans Ordered: No orders of the defined types were placed in this encounter.   Management:: metrogel   Follow up Return if symptoms worsen or fail to improve.       All questions were answered.

## 2020-08-08 ENCOUNTER — Encounter: Payer: Self-pay | Admitting: Women's Health

## 2020-08-08 ENCOUNTER — Other Ambulatory Visit: Payer: Self-pay

## 2020-08-08 ENCOUNTER — Ambulatory Visit (INDEPENDENT_AMBULATORY_CARE_PROVIDER_SITE_OTHER): Payer: 59 | Admitting: Women's Health

## 2020-08-08 ENCOUNTER — Other Ambulatory Visit (HOSPITAL_COMMUNITY)
Admission: RE | Admit: 2020-08-08 | Discharge: 2020-08-08 | Disposition: A | Discharge status: Home / Self Care | Payer: 59 | Source: Ambulatory Visit | Attending: Obstetrics & Gynecology | Admitting: Obstetrics & Gynecology

## 2020-08-08 VITALS — BP 125/75 | HR 82 | Ht 62.0 in | Wt 268.0 lb

## 2020-08-08 DIAGNOSIS — E282 Polycystic ovarian syndrome: Secondary | ICD-10-CM | POA: Diagnosis not present

## 2020-08-08 DIAGNOSIS — N926 Irregular menstruation, unspecified: Secondary | ICD-10-CM | POA: Insufficient documentation

## 2020-08-08 NOTE — Progress Notes (Signed)
   GYN VISIT Patient name: Cassandra [name] MRN [id number]  Date of birth: 03/21/1991 Chief Complaint:   irregular cycles (period 10-2thur 11again on 29 thur 11-3)  History of Present Illness:   EBONIQUE [name] is a 29 y.o. G1P0 Caucasian female being seen today for report of irregular periods. Has PCOS. Periods had become more regular after having baby 3 years ago, but have started becoming irregular again. Had period in July. Then at end of Sept into early Oct, then again late Oct into Nov- and still having daily spotting since, only when wiping. Sometimes red, sometimes dark. Denies abnormal discharge, itching/odor/irritation.  Does not want birth control. Is not actively trying to become pregnant, but not preventing- so is ok if it happens.  Had to see fertility specialist for last pregnancy.    Depression screen [facility] 2/9 06/11/2016  Decreased Interest 0  Down, Depressed, Hopeless 0  PHQ - 2 Score 0  Altered sleeping 1  Tired, decreased energy 1  Change in appetite 0  Feeling bad or failure about yourself  1  Trouble concentrating 0  Moving slowly or fidgety/restless 0  Suicidal thoughts 0  PHQ-9 Score 3    No LMP recorded. (Menstrual status: Other). The current method of family planning is none.  Last pap thinks it was in 2018 w/ fertility specialist. Results were:  normal Review of Systems:   Pertinent items are noted in HPI Denies fever/chills, dizziness, headaches, visual disturbances, fatigue, shortness of breath, chest pain, abdominal pain, vomiting, abnormal vaginal discharge/itching/odor/irritation, problems with periods, bowel movements, urination, or intercourse unless otherwise stated above.  Pertinent History Reviewed:  Reviewed past medical,surgical, social, obstetrical and family history.  Reviewed problem list, medications and allergies. Physical Assessment:   Vitals:   08/08/20 0923  BP: 125/75  Pulse: 82  Weight: 268 lb (121.6 kg)  Height: 5\' 2"  (1.575 m)   Body mass index is 49.02 kg/m.       Physical Examination:   General appearance: alert, well appearing, and in no distress  Mental status: alert, oriented to person, place, and time  Skin: warm & dry   Cardiovascular: normal heart rate noted  Respiratory: normal respiratory effort, no distress  Abdomen: soft, non-tender   Pelvic: VULVA: normal appearing vulva with no masses, tenderness or lesions, VAGINA: normal appearing vagina with normal color and discharge, no lesions, CERVIX: normal appearing cervix without discharge or lesions  Extremities: no edema   Chaperone: Peggy Dones    No results found for this or any previous visit (from the past 24 hour(s)).  Assessment & Plan:  1) PCOS, irregular bleeding> CV swab, if neg does not want birth control. Could try megace if needed  Meds: No orders of the defined types were placed in this encounter.   No orders of the defined types were placed in this encounter.   Return for 1st available, Pap & physical.  CNM, [facility] 08/08/2020 9:58 AM

## 2020-08-10 LAB — CERVICOVAGINAL ANCILLARY ONLY
Bacterial Vaginitis (gardnerella): NEGATIVE
Candida Glabrata: NEGATIVE
Candida Vaginitis: NEGATIVE
Chlamydia: NEGATIVE
Comment: NEGATIVE
Comment: NEGATIVE
Comment: NEGATIVE
Comment: NEGATIVE
Comment: NEGATIVE
Comment: NORMAL
Neisseria Gonorrhea: NEGATIVE
Trichomonas: POSITIVE — AB

## 2020-08-12 ENCOUNTER — Other Ambulatory Visit: Payer: Self-pay | Admitting: Women's Health

## 2020-08-12 ENCOUNTER — Encounter: Payer: Self-pay | Admitting: Women's Health

## 2020-08-12 DIAGNOSIS — A599 Trichomoniasis, unspecified: Secondary | ICD-10-CM | POA: Insufficient documentation

## 2020-08-12 MED ORDER — METRONIDAZOLE 500 MG PO TABS: 500.0000 mg | ORAL_TABLET | Freq: Two times a day (BID) | ORAL | 0 refills | Status: DC

## 2020-08-14 ENCOUNTER — Other Ambulatory Visit: Payer: Self-pay | Admitting: Women's Health

## 2020-08-14 NOTE — Progress Notes (Signed)
Metronidazole 2gm po x 1 called into [facility] for Theodore Rahrig  DOB [birth year] Cheral Marker, CNM, [facility] 08/14/2020 9:32 AM

## 2020-09-25 ENCOUNTER — Other Ambulatory Visit: Payer: 59 | Admitting: Women's Health

## 2022-01-15 ENCOUNTER — Telehealth: Payer: Self-pay | Admitting: *Deleted

## 2022-01-15 NOTE — Telephone Encounter (Signed)
Pt called with concerns of heavier than normal period. Pt has not been seen since 2021. Offered available appointment with Victorino Dike tomorrow. Pt agrees to that appt.  ?

## 2022-01-16 ENCOUNTER — Ambulatory Visit: Payer: 59 | Admitting: Adult Health

## 2022-01-16 ENCOUNTER — Encounter: Payer: Self-pay | Admitting: Adult Health

## 2022-01-16 ENCOUNTER — Other Ambulatory Visit (HOSPITAL_COMMUNITY)
Admission: RE | Admit: 2022-01-16 | Discharge: 2022-01-16 | Disposition: A | Discharge status: Home / Self Care | Payer: 59 | Source: Ambulatory Visit | Attending: Adult Health | Admitting: Adult Health

## 2022-01-16 VITALS — BP 145/97 | HR 79 | Ht 62.0 in | Wt 239.5 lb

## 2022-01-16 DIAGNOSIS — N920 Excessive and frequent menstruation with regular cycle: Secondary | ICD-10-CM | POA: Insufficient documentation

## 2022-01-16 DIAGNOSIS — N946 Dysmenorrhea, unspecified: Secondary | ICD-10-CM

## 2022-01-16 DIAGNOSIS — Z131 Encounter for screening for diabetes mellitus: Secondary | ICD-10-CM | POA: Diagnosis not present

## 2022-01-16 DIAGNOSIS — R030 Elevated blood-pressure reading, without diagnosis of hypertension: Secondary | ICD-10-CM | POA: Diagnosis not present

## 2022-01-16 DIAGNOSIS — Z3202 Encounter for pregnancy test, result negative: Secondary | ICD-10-CM | POA: Diagnosis not present

## 2022-01-16 DIAGNOSIS — Z8742 Personal history of other diseases of the female genital tract: Secondary | ICD-10-CM | POA: Insufficient documentation

## 2022-01-16 DIAGNOSIS — Z124 Encounter for screening for malignant neoplasm of cervix: Secondary | ICD-10-CM | POA: Diagnosis not present

## 2022-01-16 LAB — POCT URINE PREGNANCY: Preg Test, Ur: NEGATIVE

## 2022-01-16 NOTE — Progress Notes (Signed)
?  Subjective:  ?  ? Patient ID: Cassandra [name], female   DOB: 1990/10/03, 30 y.o.   MRN: [id number] ? ?HPI ?Cassandra [name] is a 31 year old white female, married, G1P1, in complaining of heavy bleeding with this period and cramps. History of PCO.  ?She needs a pap. ? ?PCP is Rodman Comp PA at [facility]. ? ?Review of Systems ?This period is heavy, changing every 1.5 hours  ?+cramps  ?  Reviewed past medical,surgical, social and family history. Reviewed medications and allergies.  ?Objective:  ? Physical Exam ?BP (!) 145/97 (BP Location: Left Arm, Patient Position: Sitting, Cuff Size: Large)   Pulse 79   Ht 5\' 2"  (1.575 m)   Wt 239 lb 8 oz (108.6 kg)   LMP 01/13/2022   BMI 43.81 kg/m?  UPT is negative  ?Skin warm and dry.Pelvic: external genitalia is normal in appearance no lesions, vagina:+blood,urethra has no lesions or masses noted, cervix:smooth and bulbous, pap with GC/CHL and HR HPV genotyping, uterus: normal size, shape and contour, non tender, no masses felt, adnexa: no masses or tenderness noted. Bladder is non tender and no masses felt. ? ?  Fall risk is low ? [facility] - 01/16/22 1635   ? ?  ? Pregnancy Intention Screening  ? Does the patient want to become pregnant in the next year? Ok Either Way   ? Does the patient's partner want to become pregnant in the next year? Ok Either Way   ? Would the patient like to discuss contraceptive options today? No   ?  ? Contraception Wrap Up  ? Current Method No Method - Other Reason   ? End Method No Method - Other Reason   ? ?  ?  ? ?  ? Examination chaperoned by 03/18/22 LPN ? ?Assessment:  ?   ?1. Routine cervical smear ?Pap sent ?Pap in 3 years if normal ?- Cytology - PAP( [facility]) ? ?2. Pregnancy examination or test, negative result ?- POCT urine pregnancy ? ?3. Menorrhagia with regular cycle ?Will check labs ?Malachy Mood scheduled at [facility] 01/23/22 at 1:30 pm to assess uterus and ovaries ?- CBC ?- Comprehensive metabolic panel ?- TSH ?- 03/25/22 PELVIC COMPLETE WITH  TRANSVAGINAL; Future ? ?4. Menstrual cramps ? ?- US PELVIC COMPLETE WITH TRANSVAGINAL; Future ? ?5. Screening for diabetes mellitus ?- Hemoglobin A1c ? ?6. Elevated BP without diagnosis of hypertension ?Keep check on BP ? ?7. History of PCOS ? ?   ?Plan:  ?   ?Will talk when labs and [place] results back  ?   ?

## 2022-01-17 DIAGNOSIS — N920 Excessive and frequent menstruation with regular cycle: Secondary | ICD-10-CM | POA: Diagnosis not present

## 2022-01-17 DIAGNOSIS — Z131 Encounter for screening for diabetes mellitus: Secondary | ICD-10-CM | POA: Diagnosis not present

## 2022-01-18 LAB — COMPREHENSIVE METABOLIC PANEL
ALT: 34 IU/L — ABNORMAL HIGH (ref 0–32)
AST: 21 IU/L (ref 0–40)
Albumin/Globulin Ratio: 1.7 (ref 1.2–2.2)
Albumin: 4.2 g/dL (ref 3.9–5.0)
Alkaline Phosphatase: 86 IU/L (ref 44–121)
BUN/Creatinine Ratio: 16 (ref 9–23)
BUN: 10 mg/dL (ref 6–20)
Bilirubin Total: 0.3 mg/dL (ref 0.0–1.2)
CO2: 22 mmol/L (ref 20–29)
Calcium: 9.6 mg/dL (ref 8.7–10.2)
Chloride: 103 mmol/L (ref 96–106)
Creatinine, Ser: 0.64 mg/dL (ref 0.57–1.00)
Globulin, Total: 2.5 g/dL (ref 1.5–4.5)
Glucose: 204 mg/dL — ABNORMAL HIGH (ref 70–99)
Potassium: 4.3 mmol/L (ref 3.5–5.2)
Sodium: 140 mmol/L (ref 134–144)
Total Protein: 6.7 g/dL (ref 6.0–8.5)
eGFR: 122 mL/min/{1.73_m2} (ref >59)

## 2022-01-18 LAB — HEMOGLOBIN A1C
Est. average glucose Bld gHb Est-mCnc: 217 mg/dL
Hgb A1c MFr Bld: 9.2 % — ABNORMAL HIGH (ref 4.8–5.6)

## 2022-01-18 LAB — CBC
Hematocrit: 44.8 % (ref 34.0–46.6)
Hemoglobin: 15.2 g/dL (ref 11.1–15.9)
MCH: 30 pg (ref 26.6–33.0)
MCHC: 33.9 g/dL (ref 31.5–35.7)
MCV: 89 fL (ref 79–97)
Platelets: 278 10*3/uL (ref 150–450)
RBC: 5.06 x10E6/uL (ref 3.77–5.28)
RDW: 12.5 % (ref 11.7–15.4)
WBC: 9.9 10*3/uL (ref 3.4–10.8)

## 2022-01-18 LAB — TSH: TSH: 2.02 u[IU]/mL (ref 0.450–4.500)

## 2022-01-22 LAB — CYTOLOGY - PAP
Chlamydia: NEGATIVE
Comment: NEGATIVE
Comment: NEGATIVE
Comment: NORMAL
Diagnosis: NEGATIVE
High risk HPV: NEGATIVE
Neisseria Gonorrhea: NEGATIVE

## 2022-01-23 ENCOUNTER — Telehealth: Payer: Self-pay | Admitting: Adult Health

## 2022-01-23 ENCOUNTER — Ambulatory Visit (HOSPITAL_COMMUNITY): Payer: 59

## 2022-01-23 DIAGNOSIS — E119 Type 2 diabetes mellitus without complications: Secondary | ICD-10-CM | POA: Insufficient documentation

## 2022-01-23 NOTE — Telephone Encounter (Signed)
Pt aware of labs and pap. A1c 9.2 referred to endocrinology in [place] ?

## 2022-02-14 ENCOUNTER — Ambulatory Visit (INDEPENDENT_AMBULATORY_CARE_PROVIDER_SITE_OTHER): Payer: 59

## 2022-02-14 DIAGNOSIS — N920 Excessive and frequent menstruation with regular cycle: Secondary | ICD-10-CM

## 2022-02-14 DIAGNOSIS — N946 Dysmenorrhea, unspecified: Secondary | ICD-10-CM

## 2022-02-14 NOTE — Progress Notes (Signed)
PELVIC US TA/TV: homogeneous anteverted uterus,WNL,EEC 6.5 mm,bilateral multiple small ovarian peripheral follicles,ovaries appear mobile,no free fluid,no pain during ultrasound  Chaperone Tammy

## 2022-02-20 ENCOUNTER — Telehealth: Payer: Self-pay | Admitting: Adult Health

## 2022-02-20 NOTE — Telephone Encounter (Signed)
Left message that US showed normal uterus and ovaries look good, but had multiple follicles like PCO

## 2022-02-24 ENCOUNTER — Other Ambulatory Visit: Payer: 59

## 2022-04-17 ENCOUNTER — Other Ambulatory Visit: Payer: Self-pay | Admitting: Adult Health

## 2022-04-17 DIAGNOSIS — E119 Type 2 diabetes mellitus without complications: Secondary | ICD-10-CM

## 2022-04-17 MED ORDER — METFORMIN HCL 500 MG PO TABS: 500.0000 mg | ORAL_TABLET | Freq: Two times a day (BID) | ORAL | 2 refills | Status: DC

## 2022-04-17 NOTE — Progress Notes (Signed)
R sent for metformin 500 mg 1 bid, and referred to DR Nida's office

## 2022-05-07 ENCOUNTER — Encounter: Payer: Self-pay | Admitting: *Deleted

## 2022-05-07 ENCOUNTER — Ambulatory Visit: Payer: Self-pay | Admitting: *Deleted

## 2022-05-07 NOTE — Patient Instructions (Signed)
Visit Information  Thank you for taking time to visit with me today. Please don't hesitate to contact me if I can be of assistance to you.   Please call the care guide team at [phone] if you need to cancel or reschedule your appointment.   If you are experiencing a Mental Health or Behavioral Health Crisis or need someone to talk to, please call the Suicide and Crisis Lifeline: 988 call the [place] National Suicide Prevention Lifeline: 1-800-273-8255 or TTY: 1-800-799-4 TTY (1-800-799-4889) to talk to a trained counselor call 1-800-273-TALK (toll free, 24 hour hotline) go to [facility] [address], [place] ([phone]) call the [place] Crisis Line: 800-939-9988 call 911  Patient verbalizes understanding of instructions and care plan provided today and agrees to view in MyChart. Active MyChart status and patient understanding of how to access instructions and care plan via MyChart confirmed with patient.     No further follow up required.  Amberrose Friebel, BSW, MSW, LCSW  Licensed Clinical Social Worker  [facility]  Mailing Address-[address], [place], [place] [address] Physical Address-[address], [place], [place] [address] Toll Free Main # [phone] Fax # [phone] Cell # [phone] [email or web address]            

## 2022-05-07 NOTE — Patient Outreach (Signed)
  Care Coordination   Initial Visit Note   05/07/2022  Name: Cassandra [name] MRN: [id number] DOB: Apr 02, 1991  Cassandra [name] is a 31 y.o. year old female who sees Lovey Newcomer, [place] for primary care. I spoke with Cassandra [name] by phone today.  What matters to the patients health and wellness today?  No Intervention Indicated.  Assisted patient with scheduling a follow-up appointment with Daria Pastures, PA, Primary Care Provider.   Goals Addressed   None     SDOH assessments and interventions completed:  Yes.    SDOH Interventions Today    Flowsheet Row Most Recent Value  SDOH Interventions   Food Insecurity Interventions Intervention Not Indicated  Financial Strain Interventions Intervention Not Indicated  Housing Interventions Intervention Not Indicated  Physical Activity Interventions Patient Refused  Stress Interventions Intervention Not Indicated  Social Connections Interventions Intervention Not Indicated  Transportation Interventions Intervention Not Indicated        Care Coordination Interventions Activated:  Yes.   Care Coordination Interventions:  Yes, provided.   Follow up plan: No further intervention required.   Encounter Outcome:  Pt. Visit Completed.   Danford Bad, BSW, MSW, LCSW  Licensed Restaurant manager, [facility]  Mailing [place] [address] [place], [place] [address] Physical Address-[address] [place], [place] [address] Toll Free Main # [phone] Fax # [phone] Cell # [phone] [email or web address]

## 2022-05-26 LAB — BASIC METABOLIC PANEL
BUN: 9 (ref 4–21)
Creatinine: 0.7 (ref 0.5–1.1)
Glucose: 131

## 2022-05-26 LAB — COMPREHENSIVE METABOLIC PANEL: eGFR: 114

## 2022-05-26 LAB — HEMOGLOBIN A1C: Hemoglobin A1C: 7.3

## 2022-06-03 DIAGNOSIS — R69 Illness, unspecified: Secondary | ICD-10-CM | POA: Diagnosis not present

## 2022-06-09 ENCOUNTER — Ambulatory Visit: Payer: 59 | Admitting: Nurse Practitioner

## 2022-06-09 ENCOUNTER — Encounter: Payer: Self-pay | Admitting: Nurse Practitioner

## 2022-06-09 VITALS — BP 118/82 | HR 62 | Ht 62.0 in | Wt 231.4 lb

## 2022-06-09 DIAGNOSIS — E1165 Type 2 diabetes mellitus with hyperglycemia: Secondary | ICD-10-CM

## 2022-06-09 DIAGNOSIS — I10 Essential (primary) hypertension: Secondary | ICD-10-CM

## 2022-06-09 DIAGNOSIS — E782 Mixed hyperlipidemia: Secondary | ICD-10-CM

## 2022-06-09 MED ORDER — ACCU-CHEK GUIDE VI STRP: ORAL_STRIP | 12 refills | Status: AC

## 2022-06-09 MED ORDER — ACCU-CHEK SOFTCLIX LANCETS MISC: 12 refills | Status: AC

## 2022-06-09 NOTE — Patient Instructions (Signed)

## 2022-06-09 NOTE — Progress Notes (Signed)
Endocrinology Consult Note       06/09/2022, 1:57 PM   Subjective:    Patient ID: Cassandra [name], female    DOB: [birth year].  ATIRA BORELLO is being seen in consultation for management of currently uncontrolled symptomatic diabetes requested by  Lavella Lemons, PA.   Past Medical History:  Diagnosis Date   Anovulation    Bipolar 1 disorder ([place])    no medication   Contraceptive management 11/22/2013   Diabetes mellitus without complication ([place])    Encounter for Implanon removal 11/22/2013   Could not remove today will try again in 2 weeks, has ben in since 2010 and pt has gained weight   Hx of migraines    Infertility, female    Irregular intermenstrual bleeding 12/13/2014   Irregular periods 09/26/2014   Spots no true flow in 6 years   Obesity    PCO (polycystic ovaries) 10/05/2014    Past Surgical History:  Procedure Laterality Date   CESAREAN SECTION N/A 12/30/2016   Procedure: CESAREAN SECTION;  Surgeon: Jonnie Kind, MD;  Location: [facility];  Service: Obstetrics;  Laterality: N/A;   WISDOM TOOTH EXTRACTION      Social History   Socioeconomic History   Marital status: Married    Spouse name: Aryanne Gilleland   Number of children: 1   Years of education: 12   Highest education level: 12th grade  Occupational History   Not on file  Tobacco Use   Smoking status: Every Day    Types: Cigars    Passive exposure: Current   Smokeless tobacco: Never   Tobacco comments:    Discussed Smoking Cessation.  Vaping Use   Vaping Use: Never used  Substance and Sexual Activity   Alcohol use: Yes    Comment: occ   Drug use: No   Sexual activity: Yes    Partners: Male    Birth control/protection: None  Other Topics Concern   Not on file  Social History Narrative   Not on file   Social Determinants of Health   Financial Resource Strain: Low Risk  (05/07/2022)   Overall  Financial Resource Strain (CARDIA)    Difficulty of Paying Living Expenses: Not hard at all  Food Insecurity: No Food Insecurity (05/07/2022)   Hunger Vital Sign    Worried About Running Out of Food in the Last Year: Never true    Ran Out of Food in the Last Year: Never true  Transportation Needs: No Transportation Needs (05/07/2022)   PRAPARE - Hydrologist (Medical): No    Lack of Transportation (Non-Medical): No  Physical Activity: Inactive (05/07/2022)   Exercise Vital Sign    Days of Exercise per Week: 0 days    Minutes of Exercise per Session: 0 min  Stress: No Stress Concern Present (05/07/2022)   [place]    Feeling of Stress : Only a little  Social Connections: Socially Integrated (05/07/2022)   Social Connection and Isolation Panel [NHANES]    Frequency of Communication with Friends and Family: More than three times a week    Frequency of Social Gatherings  with Friends and Family: More than three times a week    Attends Religious Services: More than 4 times per year    Active Member of Clubs or Organizations: Yes    Attends Music therapist: More than 4 times per year    Marital Status: Married    Family History  Problem Relation Age of Onset   Cancer Maternal Grandmother        stomach   Cancer Maternal Grandfather        lung   COPD Mother    Emphysema Mother    Hypertension Mother     Outpatient Encounter Medications as of 06/09/2022  Medication Sig   Accu-Chek Softclix Lancets lancets Use as instructed to monitor glucose twice daily   glucose blood (ACCU-CHEK GUIDE) test strip Use as instructed to monitor glucose twice daily   metFORMIN (GLUCOPHAGE) 500 MG tablet Take 1 tablet (500 mg total) by mouth 2 (two) times daily with a meal. (Patient not taking: Reported on 06/09/2022)   No facility-administered encounter medications on file as of 06/09/2022.     ALLERGIES: No Known Allergies  VACCINATION STATUS: Immunization History  Administered Date(s) Administered   Influenza,inj,Quad PF,6+ Mos 06/11/2016    Diabetes She presents for her initial diabetic visit. She has type 2 diabetes mellitus. Onset time: Diagnosed at time she was told she had PCOS around 31 y/o. Her disease course has been improving. Hypoglycemia symptoms include dizziness, hunger and sleepiness. Associated symptoms include weight loss. There are no hypoglycemic complications. Symptoms are stable. There are no diabetic complications. Risk factors for coronary artery disease include diabetes mellitus, obesity, tobacco exposure and sedentary lifestyle. Current diabetic treatment includes diet. She is compliant with treatment most of the time. Her weight is decreasing steadily. She is following a generally healthy diet. When asked about meal planning, she reported none. She has had a previous visit with a dietitian (for gestational diabetes previously). (She presents today for her consultation with no meter or logs to review.  Her most recent A1c was 7.3% on 9/11, improving from last A1c of 9.2%.  She has worked on [employer], increasing protein and lowering carbs.  She does not monitor glucose routinely, nor does she take any medication for her diabetes.   She has been on Metformin in the past but could not tolerate the side effects due to severe GI issues, even in the ER form.  She drinks mostly water (with SF flavorings) and coffee with creamer.  She eats 2 meals per day with a snack in between.  She does not engage in routine physical activity but has plans to restart a gym.  She is due for eye exam, has never seen podiatry in the past.) An ACE inhibitor/angiotensin II receptor blocker is not being taken. She does not see a podiatrist.Eye exam is not current.     Review of systems  Constitutional: + steadily decreasing body weight, current Body mass index is 42.32 kg/m., no  fatigue, no subjective hyperthermia, no subjective hypothermia Eyes: no blurry vision, no xerophthalmia ENT: no sore throat, no nodules palpated in throat, no dysphagia/odynophagia, no hoarseness Cardiovascular: no chest pain, no shortness of breath, no palpitations, no leg swelling Respiratory: no cough, no shortness of breath Gastrointestinal: no nausea/vomiting/diarrhea Musculoskeletal: no muscle/joint aches Skin: no rashes, no hyperemia Neurological: no tremors, no numbness, no tingling, no dizziness Psychiatric: no depression, no anxiety  Objective:     BP 118/82 (BP Location: Left Arm, Patient Position: Sitting, Cuff  Size: Large)   Pulse 62   Ht 5' 2" (1.575 m)   Wt 231 lb 6.4 oz (105 kg)   BMI 42.32 kg/m   Wt Readings from Last 3 Encounters:  06/09/22 231 lb 6.4 oz (105 kg)  01/16/22 239 lb 8 oz (108.6 kg)  08/08/20 268 lb (121.6 kg)     BP Readings from Last 3 Encounters:  06/09/22 118/82  01/16/22 (!) 145/97  08/08/20 125/75     Physical Exam- Limited  Constitutional:  Body mass index is 42.32 kg/m. , not in acute distress, normal state of mind Eyes:  EOMI, no exophthalmos Neck: Supple Cardiovascular: RRR, no murmurs, rubs, or gallops, no edema Respiratory: Adequate breathing efforts, no crackles, rales, rhonchi, or wheezing Musculoskeletal: no gross deformities, strength intact in all four extremities, no gross restriction of joint movements Skin:  no rashes, no hyperemia Neurological: no tremor with outstretched hands   Diabetic Foot Exam - Simple   No data filed     CMP ( most recent) CMP     Component Value Date/Time   NA 140 01/17/2022 1020   K 4.3 01/17/2022 1020   CL 103 01/17/2022 1020   CO2 22 01/17/2022 1020   GLUCOSE 204 (H) 01/17/2022 1020   GLUCOSE 97 01/01/2017 0536   BUN 9 05/26/2022 0000   CREATININE 0.7 05/26/2022 0000   CREATININE 0.64 01/17/2022 1020   CREATININE 0.54 02/13/2014 0927   CALCIUM 9.6 01/17/2022 1020   PROT 6.7  01/17/2022 1020   ALBUMIN 4.2 01/17/2022 1020   AST 21 01/17/2022 1020   ALT 34 (H) 01/17/2022 1020   ALKPHOS 86 01/17/2022 1020   BILITOT 0.3 01/17/2022 1020   GFRNONAA >60 09/24/2010 0959   GFRAA  09/24/2010 0959    >60        The eGFR has been calculated using the MDRD equation. This calculation has not been validated in all clinical situations. eGFR's persistently <60 mL/min signify possible Chronic Kidney Disease.     Diabetic Labs (most recent): Lab Results  Component Value Date   HGBA1C 7.3 05/26/2022   HGBA1C 9.2 (H) 01/17/2022   HGBA1C 5.3 06/12/2016     Lipid Panel ( most recent) Lipid Panel     Component Value Date/Time   CHOL 164 02/13/2014 0927   TRIG 136 02/13/2014 0927   HDL 30 (L) 02/13/2014 0927   CHOLHDL 5.5 02/13/2014 0927   VLDL 27 02/13/2014 0927   LDLCALC 107 (H) 02/13/2014 0927      Lab Results  Component Value Date   TSH 2.020 01/17/2022   TSH 2.579 02/13/2014           Assessment & Plan:   1) Type 2 diabetes mellitus with hyperglycemia, without long-term current use of insulin ([place])  She presents today for her consultation with no meter or logs to review.  Her most recent A1c was 7.3% on 9/11, improving from last A1c of 9.2%.  She has worked on [employer], increasing protein and lowering carbs.  She does not monitor glucose routinely, nor does she take any medication for her diabetes.   She has been on Metformin in the past but could not tolerate the side effects due to severe GI issues, even in the ER form.  She drinks mostly water (with SF flavorings) and coffee with creamer.  She eats 2 meals per day with a snack in between.  She does not engage in routine physical activity but has plans to restart a gym.  She is due for eye exam, has never seen podiatry in the past.  - KEYIA MORETTO has currently uncontrolled symptomatic type 2 DM since 31 years of age, with most recent A1c of 7.3 %.   -Recent labs reviewed.  - I had a  long discussion with her about the progressive nature of diabetes and the pathology behind its complications. -her diabetes is not currently complicated but she remains at a high risk for more acute and chronic complications which include CAD, CVA, CKD, retinopathy, and neuropathy. These are all discussed in detail with her.  The following Lifestyle Medicine recommendations according to [facility]) were discussed and offered to patient and she agrees to start the journey:  A. Whole Foods, Plant-based plate comprising of fruits and vegetables, plant-based proteins, whole-grain carbohydrates was discussed in detail with the patient.   A list for source of those nutrients were also provided to the patient.  Patient will use only water or unsweetened tea for hydration. B.  The need to stay away from risky substances including alcohol, smoking; obtaining 7 to 9 hours of restorative sleep, at least 150 minutes of moderate intensity exercise weekly, the importance of healthy social connections,  and stress reduction techniques were discussed. C.  A full color page of  Calorie density of various food groups per pound showing examples of each food groups was provided to the patient.  - I have counseled her on diet and weight management by adopting a carbohydrate restricted/protein rich diet. Patient is encouraged to switch to unprocessed or minimally processed complex starch and increased protein intake (animal or plant source), fruits, and vegetables. -  she is advised to stick to a routine mealtimes to eat 3 meals a day and avoid unnecessary snacks (to snack only to correct hypoglycemia).   - she acknowledges that there is a room for improvement in her food and drink choices. - Suggestion is made for her to avoid simple carbohydrates from her diet including Cakes, Sweet Desserts, Ice Cream, Soda (diet and regular), Sweet Tea, Candies, Chips, Cookies, Store Bought Juices,  Alcohol in Excess of 1-2 drinks a day, Artificial Sweeteners, Coffee Creamer, and "Sugar-free" Products. This will help patient to have more stable blood glucose profile and potentially avoid unintended weight gain.  - I have approached her with the following individualized plan to manage her diabetes and patient agrees:   She will be given every opportunity to correct her diabetes without medication.  -she is encouraged to start monitoring glucose twice daily, before breakfast and before bed, to log their readings on the clinic sheets provided, and bring them to review at follow up appointment in 1 month.  - Adjustment parameters are given to her for hypo and hyperglycemia in writing. - she is encouraged to call clinic for blood glucose levels less than 70 or above 300 mg /dl.  - she will be considered for incretin therapy as appropriate next visit.  - Specific targets for  A1c; LDL, HDL, and Triglycerides were discussed with the patient.  2) Blood Pressure /Hypertension:  her blood pressure is controlled to target without the use of antihypertensive medications.    3) Lipids/Hyperlipidemia:    There is no recent lipid panel to review, nor is she on any lipid lowering medications. Will recheck on subsequent visits.  4)  Weight/Diet:  her Body mass index is 42.32 kg/m.  -  clearly complicating her diabetes care.   she is a candidate for weight  loss. I discussed with her the fact that loss of 5 - 10% of her  current body weight will have the most impact on her diabetes management.  Exercise, and detailed carbohydrates information provided  -  detailed on discharge instructions.  5) Chronic Care/Health Maintenance: -she is not on ACEI/ARB or Statin medications and is encouraged to initiate and continue to follow up with Ophthalmology, Dentist, Podiatrist at least yearly or according to recommendations, and advised to [place]. I have recommended yearly flu vaccine and pneumonia vaccine at  least every 5 years; moderate intensity exercise for up to 150 minutes weekly; and sleep for at least 7 hours a day.  - she is advised to maintain close follow up with Lavella Lemons, PA for primary care needs, as well as her other providers for optimal and coordinated care.   - Time spent in this patient care: 60 min, of which > 50% was spent in counseling her about her diabetes and the rest reviewing her blood glucose logs, discussing her hypoglycemia and hyperglycemia episodes, reviewing her current and previous labs/studies (including abstraction from other facilities) and medications doses and developing a long term treatment plan based on the latest standards of care/guidelines; and documenting her care.    Please refer to Patient Instructions for Blood Glucose Monitoring and Insulin/Medications Dosing Guide" in media tab for additional information. Please also refer to "Patient Self Inventory" in the Media tab for reviewed elements of pertinent patient history.  Luther Parody participated in the discussions, expressed understanding, and voiced agreement with the above plans.  All questions were answered to her satisfaction. she is encouraged to contact clinic should she have any questions or concerns prior to her return visit.     Follow up plan: - Return in about 1 month (around 07/09/2022) for Diabetes F/U, Bring meter and logs.    Rayetta Pigg, [facility] [address] [place], [place] [address] Phone: [phone] Fax: [phone]  06/09/2022, 1:57 PM

## 2022-06-26 DIAGNOSIS — R69 Illness, unspecified: Secondary | ICD-10-CM | POA: Diagnosis not present

## 2022-07-08 DIAGNOSIS — R69 Illness, unspecified: Secondary | ICD-10-CM | POA: Diagnosis not present

## 2022-07-11 ENCOUNTER — Encounter: Payer: Self-pay | Admitting: Nurse Practitioner

## 2022-07-11 ENCOUNTER — Ambulatory Visit (INDEPENDENT_AMBULATORY_CARE_PROVIDER_SITE_OTHER): Payer: 59 | Admitting: Nurse Practitioner

## 2022-07-11 VITALS — BP 111/75 | HR 65 | Ht 62.0 in | Wt 229.8 lb

## 2022-07-11 DIAGNOSIS — E1165 Type 2 diabetes mellitus with hyperglycemia: Secondary | ICD-10-CM

## 2022-07-11 DIAGNOSIS — E782 Mixed hyperlipidemia: Secondary | ICD-10-CM | POA: Diagnosis not present

## 2022-07-11 DIAGNOSIS — I10 Essential (primary) hypertension: Secondary | ICD-10-CM

## 2022-07-11 MED ORDER — TRULICITY 1.5 MG/0.5ML ~~LOC~~ SOAJ: 1.5000 mg | SUBCUTANEOUS | 2 refills | Status: DC

## 2022-07-11 MED ORDER — TRULICITY 0.75 MG/0.5ML ~~LOC~~ SOAJ: 0.7500 mg | SUBCUTANEOUS | 0 refills | Status: DC

## 2022-07-11 NOTE — Progress Notes (Signed)
Endocrinology Follow Up Note       07/11/2022, 12:23 PM   Subjective:    Patient ID: Cassandra [name], female    DOB: 20-Apr-1991.  Cassandra [name] is being seen in follow up after being seen in consultation for management of currently uncontrolled symptomatic diabetes requested by  Lavella Lemons, PA.   Past Medical History:  Diagnosis Date   Anovulation    Bipolar 1 disorder ([place])    no medication   Contraceptive management 11/22/2013   Diabetes mellitus without complication ([place])    Encounter for Implanon removal 11/22/2013   Could not remove today will try again in 2 weeks, has ben in since 2010 and pt has gained weight   Hx of migraines    Infertility, female    Irregular intermenstrual bleeding 12/13/2014   Irregular periods 09/26/2014   Spots no true flow in 6 years   Obesity    PCO (polycystic ovaries) 10/05/2014    Past Surgical History:  Procedure Laterality Date   CESAREAN SECTION N/A 12/30/2016   Procedure: CESAREAN SECTION;  Surgeon: Jonnie Kind, MD;  Location: [facility];  Service: Obstetrics;  Laterality: N/A;   WISDOM TOOTH EXTRACTION      Social History   Socioeconomic History   Marital status: Married    Spouse name: Benisha Hadaway   Number of children: 1   Years of education: 12   Highest education level: 12th grade  Occupational History   Not on file  Tobacco Use   Smoking status: Every Day    Types: Cigars    Passive exposure: Current   Smokeless tobacco: Never   Tobacco comments:    Discussed Smoking Cessation.  Vaping Use   Vaping Use: Never used  Substance and Sexual Activity   Alcohol use: Yes    Comment: occ   Drug use: No   Sexual activity: Yes    Partners: Male    Birth control/protection: None  Other Topics Concern   Not on file  Social History Narrative   Not on file   Social Determinants of Health   Financial Resource Strain: Low  Risk  (05/07/2022)   Overall Financial Resource Strain (CARDIA)    Difficulty of Paying Living Expenses: Not hard at all  Food Insecurity: No Food Insecurity (05/07/2022)   Hunger Vital Sign    Worried About Running Out of Food in the Last Year: Never true    Ran Out of Food in the Last Year: Never true  Transportation Needs: No Transportation Needs (05/07/2022)   PRAPARE - Hydrologist (Medical): No    Lack of Transportation (Non-Medical): No  Physical Activity: Inactive (05/07/2022)   Exercise Vital Sign    Days of Exercise per Week: 0 days    Minutes of Exercise per Session: 0 min  Stress: No Stress Concern Present (05/07/2022)   [place]    Feeling of Stress : Only a little  Social Connections: Socially Integrated (05/07/2022)   Social Connection and Isolation Panel [NHANES]    Frequency of Communication with Friends and Family: More than three times a week  Frequency of Social Gatherings with Friends and Family: More than three times a week    Attends Religious Services: More than 4 times per year    Active Member of [employer] or Organizations: Yes    Attends Music therapist: More than 4 times per year    Marital Status: Married    Family History  Problem Relation Age of Onset   Cancer Maternal Grandmother        stomach   Cancer Maternal Grandfather        lung   COPD Mother    Emphysema Mother    Hypertension Mother     Outpatient Encounter Medications as of 07/11/2022  Medication Sig   Accu-Chek Softclix Lancets lancets Use as instructed to monitor glucose twice daily   Dulaglutide (TRULICITY) 7.67 MC/9.4BS SOPN Inject 0.75 mg into the skin once a week.   Dulaglutide (TRULICITY) 1.5 JG/2.8ZM SOPN Inject 1.5 mg into the skin once a week.   glucose blood (ACCU-CHEK GUIDE) test strip Use as instructed to monitor glucose twice daily   metFORMIN (GLUCOPHAGE) 500 MG  tablet Take 1 tablet (500 mg total) by mouth 2 (two) times daily with a meal. (Patient not taking: Reported on 07/11/2022)   No facility-administered encounter medications on file as of 07/11/2022.    ALLERGIES: No Known Allergies  VACCINATION STATUS: Immunization History  Administered Date(s) Administered   Influenza,inj,Quad PF,6+ Mos 06/11/2016    Diabetes She presents for her follow-up diabetic visit. She has type 2 diabetes mellitus. Onset time: Diagnosed at time she was told she had PCOS around 31 y/o. Her disease course has been improving. Hypoglycemia symptoms include dizziness, hunger and sleepiness. Associated symptoms include weight loss. There are no hypoglycemic complications. Symptoms are stable. There are no diabetic complications. Risk factors for coronary artery disease include diabetes mellitus, obesity, tobacco exposure and sedentary lifestyle. Current diabetic treatment includes diet. She is compliant with treatment most of the time. Her weight is decreasing steadily. She is following a generally healthy diet. When asked about meal planning, she reported none. She has had a previous visit with a dietitian (for gestational diabetes previously). Her breakfast blood glucose range is generally 110-130 mg/dl. Her bedtime blood glucose range is generally 140-180 mg/dl. (She presents today with her logs and readings on her phone showing slightly above target glycemic profile overall.  She was not due for another A1c today.  She continues to work on diet and exercise (recently joined a gym).  ) An ACE inhibitor/angiotensin II receptor blocker is not being taken. She does not see a podiatrist.Eye exam is not current.     Review of systems  Constitutional: + steadily decreasing body weight, current Body mass index is 42.03 kg/m., no fatigue, no subjective hyperthermia, no subjective hypothermia Eyes: no blurry vision, no xerophthalmia ENT: no sore throat, no nodules palpated in  throat, no dysphagia/odynophagia, no hoarseness Cardiovascular: no chest pain, no shortness of breath, no palpitations, no leg swelling Respiratory: no cough, no shortness of breath Gastrointestinal: no nausea/vomiting/diarrhea Musculoskeletal: no muscle/joint aches Skin: no rashes, no hyperemia Neurological: no tremors, no numbness, no tingling, no dizziness Psychiatric: no depression, no anxiety  Objective:     BP 111/75 (BP Location: Left Arm, Patient Position: Sitting, Cuff Size: Large)   Pulse 65   Ht _0  (1.575 m)   Wt 229 lb 12.8 oz (104.2 kg)   BMI 42.03 kg/m   Wt Readings from Last 3 Encounters:  07/11/22 229 lb 12.8 oz (  104.2 kg)  06/09/22 231 lb 6.4 oz (105 kg)  01/16/22 239 lb 8 oz (108.6 kg)     BP Readings from Last 3 Encounters:  07/11/22 111/75  06/09/22 118/82  01/16/22 (!) 145/97      Physical Exam- Limited  Constitutional:  Body mass index is 42.03 kg/m. , not in acute distress, normal state of mind Eyes:  EOMI, no exophthalmos Neck: Supple Cardiovascular: RRR, no murmurs, rubs, or gallops, no edema Respiratory: Adequate breathing efforts, no crackles, rales, rhonchi, or wheezing Musculoskeletal: no gross deformities, strength intact in all four extremities, no gross restriction of joint movements Skin:  no rashes, no hyperemia Neurological: no tremor with outstretched hands   Diabetic Foot Exam - Simple   Simple Foot Form Diabetic Foot exam was performed with the following findings: Yes 07/11/2022 10:26 AM  Visual Inspection No deformities, no ulcerations, no other skin breakdown bilaterally: Yes Sensation Testing Intact to touch and monofilament testing bilaterally: Yes Pulse Check Posterior Tibialis and Dorsalis pulse intact bilaterally: Yes Comments     CMP ( most recent) CMP     Component Value Date/Time   NA 140 01/17/2022 1020   K 4.3 01/17/2022 1020   CL 103 01/17/2022 1020   CO2 22 01/17/2022 1020   GLUCOSE 204 (H)  01/17/2022 1020   GLUCOSE 97 01/01/2017 0536   BUN 9 05/26/2022 0000   CREATININE 0.7 05/26/2022 0000   CREATININE 0.64 01/17/2022 1020   CREATININE 0.54 02/13/2014 0927   CALCIUM 9.6 01/17/2022 1020   PROT 6.7 01/17/2022 1020   ALBUMIN 4.2 01/17/2022 1020   AST 21 01/17/2022 1020   ALT 34 (H) 01/17/2022 1020   ALKPHOS 86 01/17/2022 1020   BILITOT 0.3 01/17/2022 1020   GFRNONAA >60 09/24/2010 0959   GFRAA  09/24/2010 0959    >60        The eGFR has been calculated using the MDRD equation. This calculation has not been validated in all clinical situations. eGFR's persistently <60 mL/min signify possible Chronic Kidney Disease.     Diabetic Labs (most recent): Lab Results  Component Value Date   HGBA1C 7.3 05/26/2022   HGBA1C 9.2 (H) 01/17/2022   HGBA1C 5.3 06/12/2016     Lipid Panel ( most recent) Lipid Panel     Component Value Date/Time   CHOL 164 02/13/2014 0927   TRIG 136 02/13/2014 0927   HDL 30 (L) 02/13/2014 0927   CHOLHDL 5.5 02/13/2014 0927   VLDL 27 02/13/2014 0927   LDLCALC 107 (H) 02/13/2014 0927      Lab Results  Component Value Date   TSH 2.020 01/17/2022   TSH 2.579 02/13/2014           Assessment & Plan:   1) Type 2 diabetes mellitus with hyperglycemia, without long-term current use of insulin ([place])  She presents today with her logs and readings on her phone showing slightly above target glycemic profile overall.  She was not due for another A1c today.  She continues to work on diet and exercise (recently joined a gym).    - Cassandra [name] has currently uncontrolled symptomatic type 2 DM since 31 years of age, with most recent A1c of 7.3 %.   -Recent labs reviewed.  - I had a long discussion with her about the progressive nature of diabetes and the pathology behind its complications. -her diabetes is not currently complicated but she remains at a high risk for more acute and chronic complications which include CAD,  CVA, CKD,  retinopathy, and neuropathy. These are all discussed in detail with her.  The following Lifestyle Medicine recommendations according to [facility]) were discussed and offered to patient and she agrees to start the journey:  A. Whole Foods, Plant-based plate comprising of fruits and vegetables, plant-based proteins, whole-grain carbohydrates was discussed in detail with the patient.   A list for source of those nutrients were also provided to the patient.  Patient will use only water or unsweetened tea for hydration. B.  The need to stay away from risky substances including alcohol, smoking; obtaining 7 to 9 hours of restorative sleep, at least 150 minutes of moderate intensity exercise weekly, the importance of healthy social connections,  and stress reduction techniques were discussed. C.  A full color page of  Calorie density of various food groups per pound showing examples of each food groups was provided to the patient.  - Nutritional counseling repeated at each appointment due to patients tendency to fall back in to old habits.  - The patient admits there is a room for improvement in their diet and drink choices. -  Suggestion is made for the patient to avoid simple carbohydrates from their diet including Cakes, Sweet Desserts / Pastries, Ice Cream, Soda (diet and regular), Sweet Tea, Candies, Chips, Cookies, Sweet Pastries, Store Bought Juices, Alcohol in Excess of 1-2 drinks a day, Artificial Sweeteners, Coffee Creamer, and "Sugar-free" Products. This will help patient to have stable blood glucose profile and potentially avoid unintended weight gain.   - I encouraged the patient to switch to unprocessed or minimally processed complex starch and increased protein intake (animal or plant source), fruits, and vegetables.   - Patient is advised to stick to a routine mealtimes to eat 3 meals a day and avoid unnecessary snacks (to snack only to correct  hypoglycemia).  - I have approached her with the following individualized plan to manage her diabetes and patient agrees:   I discussed and initiated GLP1 therapy with Trulicity 6.33 mg SQ weekly x 1 month, then increase to 1.5 mg SQ weekly thereafter, if tolerated well.  She has no history of pancreatitis or personal/family history of thyroid cancer.  -she is encouraged to continue monitoring glucose once daily, before breakfast and call the clinic if she has readings less than 70 or above 300 for 3 tests in a row.  - Adjustment parameters are given to her for hypo and hyperglycemia in writing.  - Specific targets for  A1c; LDL, HDL, and Triglycerides were discussed with the patient.  2) Blood Pressure /Hypertension:  her blood pressure is controlled to target without the use of antihypertensive medications.    3) Lipids/Hyperlipidemia:    There is no recent lipid panel to review, nor is she on any lipid lowering medications. Will recheck prior to next visit.  4)  Weight/Diet:  her Body mass index is 42.03 kg/m.  -  clearly complicating her diabetes care.   she is a candidate for weight loss. I discussed with her the fact that loss of 5 - 10% of her  current body weight will have the most impact on her diabetes management.  Exercise, and detailed carbohydrates information provided  -  detailed on discharge instructions.  5) Chronic Care/Health Maintenance: -she is not on ACEI/ARB or Statin medications and is encouraged to initiate and continue to follow up with Ophthalmology, Dentist, Podiatrist at least yearly or according to recommendations, and advised to [place]. I have  recommended yearly flu vaccine and pneumonia vaccine at least every 5 years; moderate intensity exercise for up to 150 minutes weekly; and sleep for at least 7 hours a day.  - she is advised to maintain close follow up with Lavella Lemons, PA for primary care needs, as well as her other providers for optimal and  coordinated care.     I spent 41 minutes in the care of the patient today including review of labs from [facility], Lipids, Thyroid Function, Hematology (current and previous including abstractions from other facilities); face-to-face time discussing  her blood glucose readings/logs, discussing hypoglycemia and hyperglycemia episodes and symptoms, medications doses, her options of short and long term treatment based on the latest standards of care / guidelines;  discussion about incorporating lifestyle medicine;  and documenting the encounter. Risk reduction counseling performed per USPSTF guidelines to reduce obesity and cardiovascular risk factors.     Please refer to Patient Instructions for Blood Glucose Monitoring and Insulin/Medications Dosing Guide"  in media tab for additional information. Please  also refer to " Patient Self Inventory" in the Media  tab for reviewed elements of pertinent patient history.  Cassandra [name] participated in the discussions, expressed understanding, and voiced agreement with the above plans.  All questions were answered to her satisfaction. she is encouraged to contact clinic should she have any questions or concerns prior to her return visit.     Follow up plan: - Return in about 3 months (around 10/11/2022) for Diabetes F/U with A1c in office, Previsit labs, Bring meter and logs.    Rayetta Pigg, [facility] [address] [place], [place] [address] Phone: [phone] Fax: [phone]  07/11/2022, 12:23 PM

## 2022-07-22 DIAGNOSIS — R69 Illness, unspecified: Secondary | ICD-10-CM | POA: Diagnosis not present

## 2022-08-21 DIAGNOSIS — R69 Illness, unspecified: Secondary | ICD-10-CM | POA: Diagnosis not present

## 2022-09-02 DIAGNOSIS — R69 Illness, unspecified: Secondary | ICD-10-CM | POA: Diagnosis not present

## 2022-09-16 DIAGNOSIS — R69 Illness, unspecified: Secondary | ICD-10-CM | POA: Diagnosis not present

## 2022-10-02 DIAGNOSIS — R69 Illness, unspecified: Secondary | ICD-10-CM | POA: Diagnosis not present

## 2022-10-15 ENCOUNTER — Ambulatory Visit: Payer: 59 | Admitting: Nurse Practitioner

## 2022-10-16 DIAGNOSIS — R69 Illness, unspecified: Secondary | ICD-10-CM | POA: Diagnosis not present

## 2022-10-28 DIAGNOSIS — F411 Generalized anxiety disorder: Secondary | ICD-10-CM | POA: Diagnosis not present

## 2022-11-06 DIAGNOSIS — F411 Generalized anxiety disorder: Secondary | ICD-10-CM | POA: Diagnosis not present

## 2022-11-10 ENCOUNTER — Ambulatory Visit: Payer: 59 | Admitting: Nurse Practitioner

## 2022-11-25 DIAGNOSIS — F411 Generalized anxiety disorder: Secondary | ICD-10-CM | POA: Diagnosis not present

## 2022-12-05 DIAGNOSIS — E1165 Type 2 diabetes mellitus with hyperglycemia: Secondary | ICD-10-CM | POA: Diagnosis not present

## 2022-12-06 LAB — LIPID PANEL
Chol/HDL Ratio: 5.1 ratio — ABNORMAL HIGH (ref 0.0–4.4)
Cholesterol, Total: 200 mg/dL — ABNORMAL HIGH (ref 100–199)
HDL: 39 mg/dL — ABNORMAL LOW (ref >39)
LDL Chol Calc (NIH): 136 mg/dL — ABNORMAL HIGH (ref 0–99)
Triglycerides: 138 mg/dL (ref 0–149)
VLDL Cholesterol Cal: 25 mg/dL (ref 5–40)

## 2022-12-06 LAB — COMPREHENSIVE METABOLIC PANEL
ALT: 26 IU/L (ref 0–32)
AST: 18 IU/L (ref 0–40)
Albumin/Globulin Ratio: 1.6 (ref 1.2–2.2)
Albumin: 4.1 g/dL (ref 3.9–4.9)
Alkaline Phosphatase: 91 IU/L (ref 44–121)
BUN/Creatinine Ratio: 14 (ref 9–23)
BUN: 9 mg/dL (ref 6–20)
Bilirubin Total: <0.2 mg/dL (ref 0.0–1.2)
CO2: 24 mmol/L (ref 20–29)
Calcium: 9.3 mg/dL (ref 8.7–10.2)
Chloride: 103 mmol/L (ref 96–106)
Creatinine, Ser: 0.64 mg/dL (ref 0.57–1.00)
Globulin, Total: 2.6 g/dL (ref 1.5–4.5)
Glucose: 108 mg/dL — ABNORMAL HIGH (ref 70–99)
Potassium: 4.4 mmol/L (ref 3.5–5.2)
Sodium: 141 mmol/L (ref 134–144)
Total Protein: 6.7 g/dL (ref 6.0–8.5)
eGFR: 121 mL/min/{1.73_m2} (ref >59)

## 2022-12-06 LAB — VITAMIN D 25 HYDROXY (VIT D DEFICIENCY, FRACTURES): Vit D, 25-Hydroxy: 18.8 ng/mL — ABNORMAL LOW (ref 30.0–100.0)

## 2022-12-06 LAB — TSH: TSH: 2.14 u[IU]/mL (ref 0.450–4.500)

## 2022-12-06 LAB — T4, FREE: Free T4: 1.27 ng/dL (ref 0.82–1.77)

## 2022-12-09 ENCOUNTER — Other Ambulatory Visit (HOSPITAL_COMMUNITY): Payer: Self-pay

## 2022-12-09 ENCOUNTER — Ambulatory Visit: Payer: 59 | Admitting: Nurse Practitioner

## 2022-12-09 ENCOUNTER — Telehealth: Payer: Self-pay

## 2022-12-09 ENCOUNTER — Encounter: Payer: Self-pay | Admitting: Nurse Practitioner

## 2022-12-09 VITALS — BP 115/80 | HR 73 | Ht 62.0 in | Wt 230.4 lb

## 2022-12-09 DIAGNOSIS — I10 Essential (primary) hypertension: Secondary | ICD-10-CM | POA: Diagnosis not present

## 2022-12-09 DIAGNOSIS — E1165 Type 2 diabetes mellitus with hyperglycemia: Secondary | ICD-10-CM

## 2022-12-09 DIAGNOSIS — E782 Mixed hyperlipidemia: Secondary | ICD-10-CM

## 2022-12-09 DIAGNOSIS — F411 Generalized anxiety disorder: Secondary | ICD-10-CM | POA: Diagnosis not present

## 2022-12-09 LAB — POCT GLYCOSYLATED HEMOGLOBIN (HGB A1C): Hemoglobin A1C: 6.2 % — AB (ref 4.0–5.6)

## 2022-12-09 LAB — POCT UA - MICROALBUMIN
Albumin/Creatinine Ratio, Urine, POC: <30
Creatinine, POC: 100 mg/dL
Microalbumin Ur, POC: 10 mg/L

## 2022-12-09 MED ORDER — TRULICITY 1.5 MG/0.5ML ~~LOC~~ SOAJ: 1.5000 mg | SUBCUTANEOUS | 2 refills | Status: AC

## 2022-12-09 MED ORDER — VITAMIN D (ERGOCALCIFEROL) 1.25 MG (50000 UNIT) PO CAPS: 50000.0000 [IU] | ORAL_CAPSULE | ORAL | 0 refills | Status: AC

## 2022-12-09 NOTE — Telephone Encounter (Signed)
Patient Advocate Encounter   Received notification from [facility] that prior authorization is required for Trulicity 1.5MG /0.5ML pen-injectors  Submitted: 12/09/22 Key [id number]   PA pending

## 2022-12-09 NOTE — Patient Instructions (Signed)

## 2022-12-09 NOTE — Progress Notes (Signed)
Endocrinology Follow Up Note       12/09/2022, 8:40 AM   Subjective:    Patient ID: Cassandra [name], female    DOB: 16-Sep-1990.  Cassandra [name] is being seen in follow up after being seen in consultation for management of currently uncontrolled symptomatic diabetes requested by  Lavella Lemons, PA.   Past Medical History:  Diagnosis Date   Anovulation    Bipolar 1 disorder ([place])    no medication   Contraceptive management 11/22/2013   Diabetes mellitus without complication ([place])    Encounter for Implanon removal 11/22/2013   Could not remove today will try again in 2 weeks, has ben in since 2010 and pt has gained weight   Hx of migraines    Infertility, female    Irregular intermenstrual bleeding 12/13/2014   Irregular periods 09/26/2014   Spots no true flow in 6 years   Obesity    PCO (polycystic ovaries) 10/05/2014    Past Surgical History:  Procedure Laterality Date   CESAREAN SECTION N/A 12/30/2016   Procedure: CESAREAN SECTION;  Surgeon: Jonnie Kind, MD;  Location: [place];  Service: Obstetrics;  Laterality: N/A;   WISDOM TOOTH EXTRACTION      Social History   Socioeconomic History   Marital status: Married    Spouse name: Halona Ewan   Number of children: 1   Years of education: 12   Highest education level: 12th grade  Occupational History   Not on file  Tobacco Use   Smoking status: Every Day    Types: Cigars    Passive exposure: Current   Smokeless tobacco: Never   Tobacco comments:    Discussed Smoking Cessation.  Vaping Use   Vaping Use: Never used  Substance and Sexual Activity   Alcohol use: Yes    Comment: occ   Drug use: No   Sexual activity: Yes    Partners: Male    Birth control/protection: None  Other Topics Concern   Not on file  Social History Narrative   Not on file   Social Determinants of Health   Financial Resource Strain: Low  Risk  (05/07/2022)   Overall Financial Resource Strain (CARDIA)    Difficulty of Paying Living Expenses: Not hard at all  Food Insecurity: No Food Insecurity (05/07/2022)   Hunger Vital Sign    Worried About Running Out of Food in the Last Year: Never true    Ran Out of Food in the Last Year: Never true  Transportation Needs: No Transportation Needs (05/07/2022)   PRAPARE - Hydrologist (Medical): No    Lack of Transportation (Non-Medical): No  Physical Activity: Inactive (05/07/2022)   Exercise Vital Sign    Days of Exercise per Week: 0 days    Minutes of Exercise per Session: 0 min  Stress: No Stress Concern Present (05/07/2022)   [place]    Feeling of Stress : Only a little  Social Connections: Socially Integrated (05/07/2022)   Social Connection and Isolation Panel [NHANES]    Frequency of Communication with Friends and Family: More than three times a week  Frequency of Social Gatherings with Friends and Family: More than three times a week    Attends Religious Services: More than 4 times per year    Active Member of [employer] or Organizations: Yes    Attends Music therapist: More than 4 times per year    Marital Status: Married    Family History  Problem Relation Age of Onset   Cancer Maternal Grandmother        stomach   Cancer Maternal Grandfather        lung   COPD Mother    Emphysema Mother    Hypertension Mother     Outpatient Encounter Medications as of 12/09/2022  Medication Sig   Accu-Chek Softclix Lancets lancets Use as instructed to monitor glucose twice daily   glucose blood (ACCU-CHEK GUIDE) test strip Use as instructed to monitor glucose twice daily   Vitamin D, Ergocalciferol, (DRISDOL) 1.25 MG (50000 UNIT) CAPS capsule Take 1 capsule (50,000 Units total) by mouth every 7 (seven) days.   [DISCONTINUED] Dulaglutide (TRULICITY) 1.5 0000000 SOPN Inject 1.5  mg into the skin once a week.   Dulaglutide (TRULICITY) 1.5 0000000 SOPN Inject 1.5 mg into the skin once a week.   [DISCONTINUED] Dulaglutide (TRULICITY) A999333 0000000 SOPN Inject 0.75 mg into the skin once a week. (Patient not taking: Reported on 12/09/2022)   [DISCONTINUED] metFORMIN (GLUCOPHAGE) 500 MG tablet Take 1 tablet (500 mg total) by mouth 2 (two) times daily with a meal. (Patient not taking: Reported on 07/11/2022)   No facility-administered encounter medications on file as of 12/09/2022.    ALLERGIES: No Known Allergies  VACCINATION STATUS: Immunization History  Administered Date(s) Administered   Influenza,inj,Quad PF,6+ Mos 06/11/2016    Diabetes She presents for her follow-up diabetic visit. She has type 2 diabetes mellitus. Onset time: Diagnosed at time she was told she had PCOS around 32 y/o. Her disease course has been improving. There are no hypoglycemic associated symptoms. Pertinent negatives for diabetes include no weight loss. There are no hypoglycemic complications. Symptoms are stable. There are no diabetic complications. Risk factors for coronary artery disease include diabetes mellitus, obesity, tobacco exposure and sedentary lifestyle. Current diabetic treatment includes diet. She is compliant with treatment most of the time. Her weight is fluctuating minimally. She is following a generally healthy diet. When asked about meal planning, she reported none. She has had a previous visit with a dietitian (for gestational diabetes previously). Her home blood glucose trend is fluctuating minimally. Her breakfast blood glucose range is generally 110-130 mg/dl. Her bedtime blood glucose range is generally 140-180 mg/dl. (She presents today with her logs on her phone showing stable glycemic profile.  Her POCT A1c today is 6.2%, improving from last visit of 7.3%.  She notes she did have some bad days and ate thing she knows she shouldn't but also did feel unwell due to GLP1  product.) An ACE inhibitor/angiotensin II receptor blocker is not being taken. She does not see a podiatrist.Eye exam is not current.     Review of systems  Constitutional: + minimally fluctuating body weight, current Body mass index is 42.14 kg/m., no fatigue, no subjective hyperthermia, no subjective hypothermia Eyes: no blurry vision, no xerophthalmia ENT: no sore throat, no nodules palpated in throat, no dysphagia/odynophagia, no hoarseness Cardiovascular: no chest pain, no shortness of breath, no palpitations, no leg swelling Respiratory: no cough, no shortness of breath Gastrointestinal: no nausea/vomiting/diarrhea Musculoskeletal: no muscle/joint aches Skin: no rashes, no hyperemia Neurological: no tremors,  no numbness, no tingling, no dizziness Psychiatric: no depression, no anxiety  Objective:     BP 115/80 (BP Location: Right Arm, Patient Position: Sitting, Cuff Size: Large)   Pulse 73   Ht 5\' 2"  (1.575 m)   Wt 230 lb 6.4 oz (104.5 kg)   BMI 42.14 kg/m   Wt Readings from Last 3 Encounters:  12/09/22 230 lb 6.4 oz (104.5 kg)  07/11/22 229 lb 12.8 oz (104.2 kg)  06/09/22 231 lb 6.4 oz (105 kg)     BP Readings from Last 3 Encounters:  12/09/22 115/80  07/11/22 111/75  06/09/22 118/82      Physical Exam- Limited  Constitutional:  Body mass index is 42.14 kg/m. , not in acute distress, normal state of mind Eyes:  EOMI, no exophthalmos Musculoskeletal: no gross deformities, strength intact in all four extremities, no gross restriction of joint movements Skin:  no rashes, no hyperemia Neurological: no tremor with outstretched hands   Diabetic Foot Exam - Simple   No data filed     CMP ( most recent) CMP     Component Value Date/Time   NA 141 12/05/2022 0927   K 4.4 12/05/2022 0927   CL 103 12/05/2022 0927   CO2 24 12/05/2022 0927   GLUCOSE 108 (H) 12/05/2022 0927   GLUCOSE 97 01/01/2017 0536   BUN 9 12/05/2022 0927   CREATININE 0.64 12/05/2022 0927    CREATININE 0.54 02/13/2014 0927   CALCIUM 9.3 12/05/2022 0927   PROT 6.7 12/05/2022 0927   ALBUMIN 4.1 12/05/2022 0927   AST 18 12/05/2022 0927   ALT 26 12/05/2022 0927   ALKPHOS 91 12/05/2022 0927   BILITOT <0.2 12/05/2022 0927   GFRNONAA >60 09/24/2010 0959   GFRAA  09/24/2010 0959    >60        The eGFR has been calculated using the MDRD equation. This calculation has not been validated in all clinical situations. eGFR's persistently <60 mL/min signify possible Chronic Kidney Disease.     Diabetic Labs (most recent): Lab Results  Component Value Date   HGBA1C 6.2 (A) 12/09/2022   HGBA1C 7.3 05/26/2022   HGBA1C 9.2 (H) 01/17/2022   MICROALBUR 10 mg/L 12/09/2022     Lipid Panel ( most recent) Lipid Panel     Component Value Date/Time   CHOL 200 (H) 12/05/2022 0927   TRIG 138 12/05/2022 0927   HDL 39 (L) 12/05/2022 0927   CHOLHDL 5.1 (H) 12/05/2022 0927   CHOLHDL 5.5 02/13/2014 0927   VLDL 27 02/13/2014 0927   LDLCALC 136 (H) 12/05/2022 0927   LABVLDL 25 12/05/2022 0927      Lab Results  Component Value Date   TSH 2.140 12/05/2022   TSH 2.020 01/17/2022   TSH 2.579 02/13/2014   FREET4 1.27 12/05/2022           Assessment & Plan:   1) Type 2 diabetes mellitus with hyperglycemia, without long-term current use of insulin ([place])  She presents today with her logs on her phone showing stable glycemic profile.  Her POCT A1c today is 6.2%, improving from last visit of 7.3%.  She notes she did have some bad days and ate thing she knows she shouldn't but also did feel unwell due to GLP1 product.  - Cassandra [name] has currently uncontrolled symptomatic type 2 DM since 32 years of age.   -Recent labs reviewed.  - I had a long discussion with her about the progressive nature of diabetes and the pathology  behind its complications. -her diabetes is not currently complicated but she remains at a high risk for more acute and chronic complications which  include CAD, CVA, CKD, retinopathy, and neuropathy. These are all discussed in detail with her.  The following Lifestyle Medicine recommendations according to [facility]) were discussed and offered to patient and she agrees to start the journey:  A. Whole Foods, Plant-based plate comprising of fruits and vegetables, plant-based proteins, whole-grain carbohydrates was discussed in detail with the patient.   A list for source of those nutrients were also provided to the patient.  Patient will use only water or unsweetened tea for hydration. B.  The need to stay away from risky substances including alcohol, smoking; obtaining 7 to 9 hours of restorative sleep, at least 150 minutes of moderate intensity exercise weekly, the importance of healthy social connections,  and stress reduction techniques were discussed. C.  A full color page of  Calorie density of various food groups per pound showing examples of each food groups was provided to the patient.  - Nutritional counseling repeated at each appointment due to patients tendency to fall back in to old habits.  - The patient admits there is a room for improvement in their diet and drink choices. -  Suggestion is made for the patient to avoid simple carbohydrates from their diet including Cakes, Sweet Desserts / Pastries, Ice Cream, Soda (diet and regular), Sweet Tea, Candies, Chips, Cookies, Sweet Pastries, Store Bought Juices, Alcohol in Excess of 1-2 drinks a day, Artificial Sweeteners, Coffee Creamer, and "Sugar-free" Products. This will help patient to have stable blood glucose profile and potentially avoid unintended weight gain.   - I encouraged the patient to switch to unprocessed or minimally processed complex starch and increased protein intake (animal or plant source), fruits, and vegetables.   - Patient is advised to stick to a routine mealtimes to eat 3 meals a day and avoid unnecessary snacks (to snack only to  correct hypoglycemia).  - I have approached her with the following individualized plan to manage her diabetes and patient agrees:   She is advised to continue Trulicity 1.5 mg SQ weekly.  Will hold on increasing for now as she is still smoking.  She does plan to quit sometime in the future though.  -she can take a break from routine glucose monitoring due to safe medication regimen at this time.  - Adjustment parameters are given to her for hypo and hyperglycemia in writing.  - Specific targets for  A1c; LDL, HDL, and Triglycerides were discussed with the patient.  2) Blood Pressure /Hypertension:  her blood pressure is controlled to target without the use of antihypertensive medications.    3) Lipids/Hyperlipidemia:    Her most recent lipid panel from 11/26/22 shows uncontrolled LDL of 136 but she tells me she was NOT fasting for this blood test.  She is not currently on any lipid lowering medications, will give her a chance to correct diet and exercise before adding on meds.   4)  Weight/Diet:  her Body mass index is 42.14 kg/m.  -  clearly complicating her diabetes care.   she is a candidate for weight loss. I discussed with her the fact that loss of 5 - 10% of her  current body weight will have the most impact on her diabetes management.  Exercise, and detailed carbohydrates information provided  -  detailed on discharge instructions.  5) Vitamin D Deficiency Her recent vitamin D  level from 12/05/22 was 18.8.  She is not currently on any supplementation.  I discussed and initiated Ergocalciferol 50000 units po weekly x 12 weeks.  Then she can switch to daily Vitamin D3 5000 units daily as maintenance dose.  6) Chronic Care/Health Maintenance: -she is not on ACEI/ARB or Statin medications and is encouraged to initiate and continue to follow up with Ophthalmology, Dentist, Podiatrist at least yearly or according to recommendations, and advised to [place]. I have recommended yearly flu  vaccine and pneumonia vaccine at least every 5 years; moderate intensity exercise for up to 150 minutes weekly; and sleep for at least 7 hours a day.  - she is advised to maintain close follow up with Lavella Lemons, PA for primary care needs, as well as her other providers for optimal and coordinated care.     I spent  46  minutes in the care of the patient today including review of labs from [place], Lipids, Thyroid Function, Hematology (current and previous including abstractions from other facilities); face-to-face time discussing  her blood glucose readings/logs, discussing hypoglycemia and hyperglycemia episodes and symptoms, medications doses, her options of short and long term treatment based on the latest standards of care / guidelines;  discussion about incorporating lifestyle medicine;  and documenting the encounter. Risk reduction counseling performed per USPSTF guidelines to reduce obesity and cardiovascular risk factors.     Please refer to Patient Instructions for Blood Glucose Monitoring and Insulin/Medications Dosing Guide"  in media tab for additional information. Please  also refer to " Patient Self Inventory" in the Media  tab for reviewed elements of pertinent patient history.  Cassandra [name] participated in the discussions, expressed understanding, and voiced agreement with the above plans.  All questions were answered to her satisfaction. she is encouraged to contact clinic should she have any questions or concerns prior to her return visit.     Follow up plan: - Return in about 4 months (around 04/10/2023) for Diabetes F/U with A1c in office, No previsit labs.   Cassandra [name], [facility] [address] [place], [place] [address] Phone: [phone] Fax: [phone]  12/09/2022, 8:40 AM

## 2022-12-10 ENCOUNTER — Other Ambulatory Visit (HOSPITAL_COMMUNITY): Payer: Self-pay

## 2022-12-12 NOTE — Telephone Encounter (Signed)
Pharmacy Patient Advocate Encounter  Prior Authorization for  Trulicity 1.5MG /0.5ML pen-injectors  has been approved by [employer]  (ins).     Effective dates: 12/10/2022 through 12/10/2023  Key [id number]

## 2022-12-23 DIAGNOSIS — F411 Generalized anxiety disorder: Secondary | ICD-10-CM | POA: Diagnosis not present

## 2023-01-06 DIAGNOSIS — F411 Generalized anxiety disorder: Secondary | ICD-10-CM | POA: Diagnosis not present

## 2023-01-22 DIAGNOSIS — F411 Generalized anxiety disorder: Secondary | ICD-10-CM | POA: Diagnosis not present

## 2023-01-27 DIAGNOSIS — R21 Rash and other nonspecific skin eruption: Secondary | ICD-10-CM | POA: Diagnosis not present

## 2023-01-27 DIAGNOSIS — R030 Elevated blood-pressure reading, without diagnosis of hypertension: Secondary | ICD-10-CM | POA: Diagnosis not present

## 2023-01-27 DIAGNOSIS — Z6841 Body Mass Index (BMI) 40.0 and over, adult: Secondary | ICD-10-CM | POA: Diagnosis not present

## 2023-02-03 DIAGNOSIS — F411 Generalized anxiety disorder: Secondary | ICD-10-CM | POA: Diagnosis not present

## 2023-02-18 DIAGNOSIS — F411 Generalized anxiety disorder: Secondary | ICD-10-CM | POA: Diagnosis not present

## 2023-03-31 DIAGNOSIS — F411 Generalized anxiety disorder: Secondary | ICD-10-CM | POA: Diagnosis not present

## 2023-04-10 ENCOUNTER — Ambulatory Visit: Payer: 59 | Admitting: Nurse Practitioner

## 2023-04-14 DIAGNOSIS — F411 Generalized anxiety disorder: Secondary | ICD-10-CM | POA: Diagnosis not present

## 2023-04-28 DIAGNOSIS — F411 Generalized anxiety disorder: Secondary | ICD-10-CM | POA: Diagnosis not present

## 2023-05-12 DIAGNOSIS — F411 Generalized anxiety disorder: Secondary | ICD-10-CM | POA: Diagnosis not present

## 2023-05-26 DIAGNOSIS — F411 Generalized anxiety disorder: Secondary | ICD-10-CM | POA: Diagnosis not present

## 2023-06-09 DIAGNOSIS — F411 Generalized anxiety disorder: Secondary | ICD-10-CM | POA: Diagnosis not present

## 2023-06-23 DIAGNOSIS — F411 Generalized anxiety disorder: Secondary | ICD-10-CM | POA: Diagnosis not present

## 2023-07-07 DIAGNOSIS — F411 Generalized anxiety disorder: Secondary | ICD-10-CM | POA: Diagnosis not present

## 2023-07-21 DIAGNOSIS — F411 Generalized anxiety disorder: Secondary | ICD-10-CM | POA: Diagnosis not present

## 2023-08-04 DIAGNOSIS — F411 Generalized anxiety disorder: Secondary | ICD-10-CM | POA: Diagnosis not present

## 2023-09-01 DIAGNOSIS — F411 Generalized anxiety disorder: Secondary | ICD-10-CM | POA: Diagnosis not present

## 2024-03-30 ENCOUNTER — Telehealth: Payer: Self-pay | Admitting: Nurse Practitioner

## 2024-03-30 DIAGNOSIS — E1165 Type 2 diabetes mellitus with hyperglycemia: Secondary | ICD-10-CM

## 2024-03-30 DIAGNOSIS — E782 Mixed hyperlipidemia: Secondary | ICD-10-CM

## 2024-03-30 DIAGNOSIS — I10 Essential (primary) hypertension: Secondary | ICD-10-CM

## 2024-03-30 NOTE — Telephone Encounter (Signed)
 Patient is requesting an appt after a long absence. I assume you want labs?

## 2024-03-30 NOTE — Telephone Encounter (Signed)
 You are correct.  I entered orders.

## 2024-05-18 ENCOUNTER — Emergency Department (HOSPITAL_COMMUNITY)
Admission: EM | Admit: 2024-05-18 | Discharge: 2024-05-18 | Disposition: A | Discharge status: Home / Self Care | Payer: Self-pay | Attending: Emergency Medicine | Admitting: Emergency Medicine

## 2024-05-18 ENCOUNTER — Other Ambulatory Visit: Payer: Self-pay

## 2024-05-18 ENCOUNTER — Encounter (HOSPITAL_COMMUNITY): Payer: Self-pay

## 2024-05-18 DIAGNOSIS — M546 Pain in thoracic spine: Secondary | ICD-10-CM | POA: Insufficient documentation

## 2024-05-18 LAB — PREGNANCY, URINE: Preg Test, Ur: NEGATIVE

## 2024-05-18 MED ORDER — CYCLOBENZAPRINE HCL 10 MG PO TABS
10.0000 mg | ORAL_TABLET | Freq: Once | ORAL | Status: AC
  Administered 2024-05-18: 10 mg via ORAL
  Filled 2024-05-18: qty 1

## 2024-05-18 MED ORDER — ACETAMINOPHEN 500 MG PO TABS
1000.0000 mg | ORAL_TABLET | Freq: Once | ORAL | Status: AC
  Administered 2024-05-18: 1000 mg via ORAL
  Filled 2024-05-18: qty 2

## 2024-05-18 MED ORDER — KETOROLAC TROMETHAMINE 30 MG/ML IJ SOLN
30.0000 mg | Freq: Once | INTRAMUSCULAR | Status: AC
  Administered 2024-05-18: 30 mg via INTRAMUSCULAR
  Filled 2024-05-18: qty 1

## 2024-05-18 MED ORDER — CYCLOBENZAPRINE HCL 10 MG PO TABS: 10.0000 mg | ORAL_TABLET | Freq: Two times a day (BID) | ORAL | 0 refills | Status: AC | PRN

## 2024-05-18 MED ORDER — CELECOXIB 200 MG PO CAPS
200.0000 mg | ORAL_CAPSULE | Freq: Two times a day (BID) | ORAL | 0 refills | Status: AC
Start: 2024-05-18 — End: ?

## 2024-05-18 NOTE — ED Triage Notes (Signed)
 Pt c/o back pain and muscle spasms in back that started yesterday.

## 2024-05-18 NOTE — ED Provider Notes (Signed)
 [place] EMERGENCY DEPARTMENT AT [facility] Provider Note   CSN: [id number] Arrival date & time: 05/18/24  0413     History No chief complaint on file.   HPI Cassandra [name] is a 33 y.o. female presenting for chief complaint of back pain. Endorses physical exertion and menstrual cycle Pain in back to thorax.  Patient's recorded medical, surgical, social, medication list and allergies were reviewed in the Snapshot window as part of the initial history.   Review of Systems   Review of Systems  Constitutional:  Negative for chills and fever.  HENT:  Negative for ear pain and sore throat.   Eyes:  Negative for pain and visual disturbance.  Respiratory:  Negative for cough and shortness of breath.   Cardiovascular:  Negative for chest pain and palpitations.  Gastrointestinal:  Negative for abdominal pain and vomiting.  Genitourinary:  Negative for dysuria and hematuria.  Musculoskeletal:  Positive for back pain. Negative for arthralgias.  Skin:  Negative for color change and rash.  Neurological:  Negative for seizures and syncope.  All other systems reviewed and are negative.   Physical Exam Updated Vital Signs BP (!) 121/101   Pulse 70   Temp 98.3 F (36.8 C)   Resp 19   SpO2 96%  Physical Exam Vitals and nursing note reviewed.  Constitutional:      General: She is not in acute distress.    Appearance: She is well-developed.  HENT:     Head: Normocephalic and atraumatic.  Eyes:     Conjunctiva/sclera: Conjunctivae normal.  Cardiovascular:     Rate and Rhythm: Normal rate and regular rhythm.     Heart sounds: No murmur heard. Pulmonary:     Effort: Pulmonary effort is normal. No respiratory distress.     Breath sounds: Normal breath sounds.  Abdominal:     General: There is no distension.     Palpations: Abdomen is soft.     Tenderness: There is no abdominal tenderness. There is no right CVA tenderness or left CVA tenderness.  Musculoskeletal:         General: No swelling or tenderness. Normal range of motion.     Cervical back: Neck supple.  Skin:    General: Skin is warm and dry.  Neurological:     General: No focal deficit present.     Mental Status: She is alert and oriented to person, place, and time. Mental status is at baseline.     Cranial Nerves: No cranial nerve deficit.      ED Course/ Medical Decision Making/ A&P    Procedures Procedures   Medications Ordered in ED Medications  ketorolac  (TORADOL ) 30 MG/ML injection 30 mg (has no administration in time range)  acetaminophen  (TYLENOL ) tablet 1,000 mg (1,000 mg Oral Given 05/18/24 0442)  cyclobenzaprine  (FLEXERIL ) tablet 10 mg (10 mg Oral Given 05/18/24 0443)   Medical Decision Making:   [name] is a 33 y.o. female who presented to the ED today with acute lower back pain over the past 4 hours, detailed above.    Patient placed on continuous vitals and telemetry monitoring while in ED which was reviewed periodically.   On my initial exam, the pt was with an intact neurologic exam, tolerating ambulation with an antalgic gait and p.o. intake without difficulty.  Patient had no abnormal DTRs, no midline spinal tenderness.  Patient endorsing complete sensation of the perineum.  Patient without episodes of fecal or urinary incontinence.  Patient has  no focal neurologic deficits and reassuring vital signs at this time.  No obvious physical abnormality or injury on exam. Notably, patient denies recent trauma, is afebrile, and denies IVDU.  Last menstrual period was today.  Reviewed and confirmed nursing documentation for past medical history, family history, social history.    Initial Assessment:   With the patient's presentation of acute back pain in the above setting, most likely diagnosis is musculoskeletal strain. Other diagnoses were considered including (but not limited to) underlying fracture, epidural hematoma, cauda equina syndrome, spinal stenosis,  spinal malignancy. These are considered less likely due to history of present illness and physical exam findings.   In particular, lack of fever, substantial history of IV drug use, or substantial neurologic abnormality is less consistent with epidural abscess versus discitis or other spinal infection. In particular,  Initial Plan:  Multimodal pain control described and patient informed on safe usage.  Screening evaluation grossly unremarkable at this time. Patient stable for continued outpatient evaluation and management of their musculoskeletal pains.  Patient referred back to primary care provider for continued evaluation and management.    Disposition:   Based on the above findings, I believe patient is stable for discharge.    Patient and family educated about specific return precautions for given chief complaint and symptoms.  Patient and family educated about follow-up with PCP.  Patient and family expressed understanding of return precautions and need for follow-up. Patient spoken to regarding all imaging and laboratory results and appropriate follow up for these results. All education provided in verbal and written form and time was allowed for answering of patient questions. Patient discharged.          Emergency Department Medication Summary:   Medications  ketorolac  (TORADOL ) 30 MG/ML injection 30 mg (has no administration in time range)  acetaminophen  (TYLENOL ) tablet 1,000 mg (1,000 mg Oral Given 05/18/24 0442)  cyclobenzaprine  (FLEXERIL ) tablet 10 mg (10 mg Oral Given 05/18/24 0443)      Clinical Impression:  1. Acute thoracic back pain, unspecified back pain laterality      Discharge   Final Clinical Impression(s) / ED Diagnoses Final diagnoses:  Acute thoracic back pain, unspecified back pain laterality    Rx / DC Orders ED Discharge Orders     None         Jerral Meth, MD 05/18/24 [phone]

## 2024-07-04 ENCOUNTER — Ambulatory Visit: Admitting: Nurse Practitioner

## 2024-07-04 DIAGNOSIS — E1165 Type 2 diabetes mellitus with hyperglycemia: Secondary | ICD-10-CM

## 2024-07-04 DIAGNOSIS — E782 Mixed hyperlipidemia: Secondary | ICD-10-CM

## 2024-07-04 DIAGNOSIS — I10 Essential (primary) hypertension: Secondary | ICD-10-CM

## 2024-07-04 DIAGNOSIS — Z7985 Long-term (current) use of injectable non-insulin antidiabetic drugs: Secondary | ICD-10-CM

## 2024-09-10 LAB — COMPREHENSIVE METABOLIC PANEL WITH GFR
ALT: 20 IU/L (ref 0–32)
AST: 13 IU/L (ref 0–40)
Albumin: 4 g/dL (ref 3.9–4.9)
Alkaline Phosphatase: 83 IU/L (ref 41–116)
BUN/Creatinine Ratio: 20 (ref 9–23)
BUN: 12 mg/dL (ref 6–20)
Bilirubin Total: 0.3 mg/dL (ref 0.0–1.2)
CO2: 26 mmol/L (ref 20–29)
Calcium: 9.2 mg/dL (ref 8.7–10.2)
Chloride: 103 mmol/L (ref 96–106)
Creatinine, Ser: 0.59 mg/dL (ref 0.57–1.00)
Globulin, Total: 2.5 g/dL (ref 1.5–4.5)
Glucose: 134 mg/dL — ABNORMAL HIGH (ref 70–99)
Potassium: 4.5 mmol/L (ref 3.5–5.2)
Sodium: 141 mmol/L (ref 134–144)
Total Protein: 6.5 g/dL (ref 6.0–8.5)
eGFR: 122 mL/min/1.73

## 2024-09-10 LAB — LIPID PANEL
Chol/HDL Ratio: 4.5 ratio — ABNORMAL HIGH (ref 0.0–4.4)
Cholesterol, Total: 197 mg/dL (ref 100–199)
HDL: 44 mg/dL
LDL Chol Calc (NIH): 134 mg/dL — ABNORMAL HIGH (ref 0–99)
Triglycerides: 102 mg/dL (ref 0–149)
VLDL Cholesterol Cal: 19 mg/dL (ref 5–40)

## 2024-09-10 LAB — T4, FREE: Free T4: 1.14 ng/dL (ref 0.82–1.77)

## 2024-09-10 LAB — VITAMIN D 25 HYDROXY (VIT D DEFICIENCY, FRACTURES): Vit D, 25-Hydroxy: 20.4 ng/mL — ABNORMAL LOW (ref 30.0–100.0)

## 2024-09-10 LAB — TSH: TSH: 3.73 u[IU]/mL (ref 0.450–4.500)

## 2024-09-12 ENCOUNTER — Ambulatory Visit: Admitting: Nurse Practitioner

## 2024-09-12 DIAGNOSIS — Z7985 Long-term (current) use of injectable non-insulin antidiabetic drugs: Secondary | ICD-10-CM

## 2024-09-12 DIAGNOSIS — I10 Essential (primary) hypertension: Secondary | ICD-10-CM

## 2024-09-12 DIAGNOSIS — E1165 Type 2 diabetes mellitus with hyperglycemia: Secondary | ICD-10-CM

## 2024-09-12 DIAGNOSIS — E782 Mixed hyperlipidemia: Secondary | ICD-10-CM

## 2024-12-22 ENCOUNTER — Ambulatory Visit: Admitting: Nurse Practitioner
